# Patient Record
Sex: Female | Born: 1965 | Race: White | Hispanic: No | Smoking: Never smoker
Health system: Southern US, Community
[De-identification: ages and names within clinical notes are randomized; demographics above are authoritative.]

## PROBLEM LIST (undated history)

## (undated) DIAGNOSIS — K219 Gastro-esophageal reflux disease without esophagitis: Secondary | ICD-10-CM

## (undated) DIAGNOSIS — T8859XA Other complications of anesthesia, initial encounter: Secondary | ICD-10-CM

## (undated) DIAGNOSIS — E559 Vitamin D deficiency, unspecified: Principal | ICD-10-CM

## (undated) DIAGNOSIS — M549 Dorsalgia, unspecified: Secondary | ICD-10-CM

## (undated) DIAGNOSIS — G44209 Tension-type headache, unspecified, not intractable: Secondary | ICD-10-CM

## (undated) DIAGNOSIS — E049 Nontoxic goiter, unspecified: Secondary | ICD-10-CM

## (undated) DIAGNOSIS — R112 Nausea with vomiting, unspecified: Secondary | ICD-10-CM

## (undated) DIAGNOSIS — R87629 Unspecified abnormal cytological findings in specimens from vagina: Secondary | ICD-10-CM

## (undated) DIAGNOSIS — I10 Essential (primary) hypertension: Secondary | ICD-10-CM

## (undated) DIAGNOSIS — T4145XA Adverse effect of unspecified anesthetic, initial encounter: Secondary | ICD-10-CM

## (undated) DIAGNOSIS — N189 Chronic kidney disease, unspecified: Secondary | ICD-10-CM

## (undated) DIAGNOSIS — F419 Anxiety disorder, unspecified: Secondary | ICD-10-CM

## (undated) DIAGNOSIS — Z9889 Other specified postprocedural states: Secondary | ICD-10-CM

## (undated) DIAGNOSIS — G8929 Other chronic pain: Secondary | ICD-10-CM

## (undated) HISTORY — DX: Chronic kidney disease, unspecified: N18.9

## (undated) HISTORY — DX: Dorsalgia, unspecified: M54.9

## (undated) HISTORY — DX: Tension-type headache, unspecified, not intractable: G44.209

## (undated) HISTORY — DX: Unspecified abnormal cytological findings in specimens from vagina: R87.629

## (undated) HISTORY — DX: Essential (primary) hypertension: I10

## (undated) HISTORY — DX: Vitamin D deficiency, unspecified: E55.9

## (undated) HISTORY — DX: Nontoxic goiter, unspecified: E04.9

---

## 1993-10-29 HISTORY — PX: TUBAL LIGATION: SHX77

## 2001-08-15 ENCOUNTER — Other Ambulatory Visit: Admission: RE | Admit: 2001-08-15 | Discharge: 2001-08-15 | Payer: Self-pay | Admitting: Obstetrics and Gynecology

## 2002-03-13 ENCOUNTER — Encounter: Payer: Self-pay | Admitting: Obstetrics & Gynecology

## 2002-03-13 ENCOUNTER — Ambulatory Visit (HOSPITAL_COMMUNITY): Admission: RE | Admit: 2002-03-13 | Discharge: 2002-03-13 | Payer: Self-pay | Admitting: Family Medicine

## 2002-06-10 ENCOUNTER — Ambulatory Visit (HOSPITAL_COMMUNITY): Admission: RE | Admit: 2002-06-10 | Discharge: 2002-06-10 | Payer: Self-pay | Admitting: Obstetrics & Gynecology

## 2002-06-10 ENCOUNTER — Encounter: Payer: Self-pay | Admitting: Obstetrics & Gynecology

## 2005-04-10 ENCOUNTER — Encounter: Admission: RE | Admit: 2005-04-10 | Discharge: 2005-04-10 | Payer: Self-pay | Admitting: Obstetrics and Gynecology

## 2007-06-08 ENCOUNTER — Emergency Department (HOSPITAL_COMMUNITY): Admission: EM | Admit: 2007-06-08 | Discharge: 2007-06-08 | Payer: Self-pay | Admitting: Emergency Medicine

## 2007-10-30 HISTORY — PX: CHOLECYSTECTOMY: SHX55

## 2008-01-01 ENCOUNTER — Emergency Department (HOSPITAL_COMMUNITY): Admission: EM | Admit: 2008-01-01 | Discharge: 2008-01-01 | Payer: Self-pay | Admitting: Emergency Medicine

## 2008-02-12 ENCOUNTER — Emergency Department (HOSPITAL_COMMUNITY): Admission: EM | Admit: 2008-02-12 | Discharge: 2008-02-12 | Payer: Self-pay | Admitting: Emergency Medicine

## 2008-07-21 ENCOUNTER — Ambulatory Visit (HOSPITAL_COMMUNITY): Admission: RE | Admit: 2008-07-21 | Discharge: 2008-07-21 | Payer: Self-pay | Admitting: Family Medicine

## 2008-07-26 ENCOUNTER — Ambulatory Visit (HOSPITAL_COMMUNITY): Admission: RE | Admit: 2008-07-26 | Discharge: 2008-07-26 | Payer: Self-pay | Admitting: General Surgery

## 2008-07-26 ENCOUNTER — Encounter (INDEPENDENT_AMBULATORY_CARE_PROVIDER_SITE_OTHER): Payer: Self-pay | Admitting: General Surgery

## 2008-10-29 HISTORY — PX: ENDOMETRIAL BIOPSY: SHX622

## 2009-05-13 ENCOUNTER — Ambulatory Visit (HOSPITAL_COMMUNITY): Admission: RE | Admit: 2009-05-13 | Discharge: 2009-05-13 | Payer: Self-pay | Admitting: Family Medicine

## 2009-09-15 ENCOUNTER — Other Ambulatory Visit: Admission: RE | Admit: 2009-09-15 | Discharge: 2009-09-15 | Payer: Self-pay | Admitting: Obstetrics and Gynecology

## 2009-09-20 ENCOUNTER — Ambulatory Visit (HOSPITAL_COMMUNITY): Admission: RE | Admit: 2009-09-20 | Discharge: 2009-09-20 | Payer: Self-pay | Admitting: Obstetrics & Gynecology

## 2009-10-29 HISTORY — PX: ENDOMETRIAL ABLATION: SHX621

## 2009-12-19 ENCOUNTER — Encounter: Admission: RE | Admit: 2009-12-19 | Discharge: 2009-12-19 | Payer: Self-pay | Admitting: Obstetrics and Gynecology

## 2009-12-26 ENCOUNTER — Encounter: Admission: RE | Admit: 2009-12-26 | Discharge: 2009-12-26 | Payer: Self-pay | Admitting: Obstetrics and Gynecology

## 2010-05-01 ENCOUNTER — Emergency Department (HOSPITAL_COMMUNITY): Admission: EM | Admit: 2010-05-01 | Discharge: 2010-05-01 | Payer: Self-pay | Admitting: Emergency Medicine

## 2010-06-30 ENCOUNTER — Encounter: Admission: RE | Admit: 2010-06-30 | Discharge: 2010-06-30 | Payer: Self-pay | Admitting: Obstetrics and Gynecology

## 2010-09-19 ENCOUNTER — Ambulatory Visit (HOSPITAL_COMMUNITY)
Admission: RE | Admit: 2010-09-19 | Discharge: 2010-09-19 | Payer: Self-pay | Source: Home / Self Care | Admitting: Obstetrics & Gynecology

## 2010-10-20 ENCOUNTER — Ambulatory Visit (HOSPITAL_COMMUNITY)
Admission: RE | Admit: 2010-10-20 | Discharge: 2010-10-20 | Payer: Self-pay | Source: Home / Self Care | Attending: Obstetrics and Gynecology | Admitting: Obstetrics and Gynecology

## 2010-11-19 ENCOUNTER — Encounter: Payer: Self-pay | Admitting: Obstetrics and Gynecology

## 2010-11-20 ENCOUNTER — Encounter: Payer: Self-pay | Admitting: Family Medicine

## 2011-01-08 LAB — COMPREHENSIVE METABOLIC PANEL
ALT: 13 U/L (ref 0–35)
AST: 15 U/L (ref 0–37)
Albumin: 3.2 g/dL — ABNORMAL LOW (ref 3.5–5.2)
CO2: 26 mEq/L (ref 19–32)
Calcium: 8.7 mg/dL (ref 8.4–10.5)
Chloride: 107 mEq/L (ref 96–112)
Glucose, Bld: 92 mg/dL (ref 70–99)
Sodium: 139 mEq/L (ref 135–145)

## 2011-01-08 LAB — URINALYSIS, ROUTINE W REFLEX MICROSCOPIC
Bilirubin Urine: NEGATIVE
Glucose, UA: NEGATIVE mg/dL
Ketones, ur: NEGATIVE mg/dL
Urobilinogen, UA: 0.2 mg/dL (ref 0.0–1.0)

## 2011-01-08 LAB — SURGICAL PCR SCREEN: Staphylococcus aureus: NEGATIVE

## 2011-01-08 LAB — URINE MICROSCOPIC-ADD ON

## 2011-01-08 LAB — CBC
Platelets: 330 10*3/uL (ref 150–400)
RBC: 3.82 MIL/uL — ABNORMAL LOW (ref 3.87–5.11)
RDW: 13.2 % (ref 11.5–15.5)
WBC: 5.1 10*3/uL (ref 4.0–10.5)

## 2011-01-14 LAB — BASIC METABOLIC PANEL
BUN: 9 mg/dL (ref 6–23)
Calcium: 8.4 mg/dL (ref 8.4–10.5)
Chloride: 106 mEq/L (ref 96–112)
Creatinine, Ser: 0.68 mg/dL (ref 0.4–1.2)
GFR calc Af Amer: >60 mL/min (ref >60)
Potassium: 3.6 mEq/L (ref 3.5–5.1)

## 2011-01-14 LAB — URINE CULTURE: Colony Count: 35000

## 2011-01-14 LAB — DIFFERENTIAL
Basophils Absolute: 0 10*3/uL (ref 0.0–0.1)
Eosinophils Absolute: 0.1 10*3/uL (ref 0.0–0.7)
Eosinophils Relative: 1 % (ref 0–5)
Lymphocytes Relative: 21 % (ref 12–46)
Lymphs Abs: 1.9 10*3/uL (ref 0.7–4.0)
Monocytes Absolute: 0.4 10*3/uL (ref 0.1–1.0)

## 2011-01-14 LAB — PREGNANCY, URINE: Preg Test, Ur: NEGATIVE

## 2011-01-14 LAB — URINALYSIS, ROUTINE W REFLEX MICROSCOPIC
Glucose, UA: NEGATIVE mg/dL
Ketones, ur: NEGATIVE mg/dL
Nitrite: NEGATIVE
Protein, ur: NEGATIVE mg/dL
pH: 7 (ref 5.0–8.0)

## 2011-01-14 LAB — URINE MICROSCOPIC-ADD ON

## 2011-01-14 LAB — CBC
Hemoglobin: 11.5 g/dL — ABNORMAL LOW (ref 12.0–15.0)
MCH: 31.6 pg (ref 26.0–34.0)
MCV: 94.3 fL (ref 78.0–100.0)
RBC: 3.63 MIL/uL — ABNORMAL LOW (ref 3.87–5.11)
WBC: 8.9 10*3/uL (ref 4.0–10.5)

## 2011-03-13 NOTE — Op Note (Signed)
NAMEJESSECA, MARSCH               ACCOUNT NO.:  [id number]   MEDICAL RECORD NO.:  [id number]          PATIENT TYPE:  AMB   LOCATION:  DAY                           FACILITY:  [facility]   PHYSICIAN:  Tilford Pillar, MD      DATE OF BIRTH:  03/17/1966   DATE OF PROCEDURE:  07/26/2008  DATE OF DISCHARGE:  07/26/2008                               OPERATIVE REPORT   PREOPERATIVE DIAGNOSES:  1. Cholelithiasis.  2. Acute cholecystitis.   POSTOPERATIVE DIAGNOSES:  1. Cholelithiasis.  2. Acute cholecystitis.   PROCEDURE:  Laparoscopic cholecystectomy.   SURGEON:  Tilford Pillar, MD   ANESTHESIA:  General endotracheal, local anesthetic, 1% Sensorcaine  plain.   SPECIMEN:  Gallbladder.   ESTIMATED BLOOD LOSS:  Minimal.   INDICATIONS:  The patient had presented with history of severe right  upper quadrant abdominal pain.  She was evaluated with right upper  quadrant ultrasound demonstrating evidence of cholelithiasis and changes  consistent with acute cholecystitis.  Her symptomatology was relatively  persistent.  She had had similar symptoms in the past and never severe.  The risks, benefits, and alternatives of a laparoscopic possible open  cholecystectomy were discussed at length with the patient including but  not limited to risk of bleeding, infection, bile leak, small bowel  injury, common bile duct injury, as well as the possibility of  intraoperative cardiac and pulmonary events.  The patient's questions  and concerns were addressed.  The patient was consented for the planned  procedure.   OPERATION:  The patient was taken to the operating room, was placed in a  supine position on the operating room table at which time the general  anesthetic was administered.  Once the patient was asleep, she was  endotracheally intubated by anesthesia.  At this point, her abdomen was  prepped with DuraPrep solution and draped in a standard fashion.  A stab  incision was created supraumbilically  with an 11-blade scalpel.  Additional dissection down through the subcuticular tissue was carried  out using a Kocher clamp which was utilized to grasp the abdominal wall  fascia and move this anterior.  A Veress needle was then inserted.  Drop  test was utilized to confirm intraperitoneal placement and then  pneumoperitoneum was initiated.  Once sufficient pneumoperitoneum was  obtained, an 11-mm trocar was inserted over laparoscope allowing  visualization of the trocar entering into the peritoneal cavity.  At  this point, the inner cannula was removed.  The laparoscope was  reinserted.  There was no evidence of any trocar or Veress needle  placement injury.  The patient was placed in a reverse Trendelenburg  left lateral decubitus position, and the remaining trocars were placed.  An 11-mm trocar in the epigastrium, a 5-mm trocar in the midline between  two 11-mm trocars and a 5-mm trocar in the right lateral abdominal wall.  At this point, the fundus of the gallbladder was grasped and lifted up  and over the right lobe of the liver.  Blunt dissection was utilized to  strip the peritoneal reflection off the infundibulum of the gallbladder.  The cystic duct was identified.  A window was created behind the cystic  duct.  Three EndoClips were placed proximally, one distally, and the  cystic duct was divided between two more cystic clips.  Similarly, the  cystic artery was identified.  Two EndoClips were placed proximally, one  distally, and cystic artery was divided between two more cystic clips.  At this point, electrocautery was utilized to dissect the gallbladder  free from the gallbladder fossa.  Hemostasis was obtained during this  time using the electrocautery.  At this time, gallbladder was removed  and was placed into an EndoCatch bag which was placed up and over the  right lobe of the liver.  Reinspecting the gallbladder fossa, there was  no evidence of any significant bleeding.   Hemostasis was excellent and a  piece of Surgicel was placed into the gallbladder fossa.  At this point,  the attention was turned to closure.   Using an Endoclose suture passing device, a 2-0 Vicryl was passed  through both the 11-mm trocar sites with these sutures in place.  The  gallbladder was removed through the epigastric trocar site in an intact  EndoCatch bag.  Some blunt and sharp dissection was required to enlarge  the trocar site adequately enough to remove the gallbladder.  Once it  was free, it was placed in the back table and was sent as a permanent  specimen to pathology.  At this time the pneumoperitoneum was evacuated,  the Vicryl sutures were secured, local anesthetic was instilled.  A 4-0  Monocryl was utilized to reapproximate the skin edges at all 4 trocar  sites and skin was washed and dried with moist and dry towel.  Benzoin  was applied around incision, 0.5-inch Steri-Strips were placed, the  drapes removed.  The patient was allowed to come out of general  anesthetic and was transferred back to regular hospital bed.  She was  transferred to postanesthetic care unit in a stable condition.  At the  conclusion of procedure, all instrument, sponge, and needle counts were  correct.  The patient tolerated the procedure well.      Tilford Pillar, MD  Electronically Signed     BZ/MEDQ  D:  08/09/2008  T:  08/10/2008  Job:  [id number]   cc:   Kirk Ruths, M.D.  Fax: [phone]

## 2011-03-13 NOTE — H&P (Signed)
Kristie Graham, Kristie Graham            ACCOUNT NO.:  [id number]   MEDICAL RECORD NO.:  [id number]         PATIENT TYPE:  PAMB   LOCATION:  DAY                           FACILITY:  [facility]   PHYSICIAN:  Tilford Pillar, MD      DATE OF BIRTH:  18-Jun-1966   DATE OF ADMISSION:  DATE OF DISCHARGE:  LH                              HISTORY & PHYSICAL   CHIEF COMPLAINT:  Pain and nausea.   HISTORY OF PRESENT ILLNESS:  The patient is a 45 year old female who  presented to my office after referral from her primary care doctor, Dr.  Regino Schultze with a history of epigastric abdominal pain.  She has had this  pain for several weeks, which approximately started 3 weeks ago with  associated nausea.  She has not had any episodes of emesis.  The pain is  in epigastrium with some radiation down to the suprapubic region as well  as some radiation to bilateral back.  She describes this also being  somewhat localized to the right upper quadrant.  While she has felt  nauseated, she has had no episodes of emesis.  She has had some  intermittent diarrhea.  She describes some of the stools as dark in  color.  She has denied any hematochezia.  She has not had any similar  symptoms in the past.  She denies any fever or chills.  She does state  she has bloating, occasional belching, and flatus.  She describes the  pain as a sharp intermittent pain, which has lasted over the last week  with more pain episodes of painful periods.  She has noted that bending  does seem to cause increased discomfort in the area and has intermittent  increase with fried or fatty greasy foods, somewhat relieved with  Sprite, otherwise nothing seems to relieve her symptomatology.  She has  had no prior EGD performed.  She has had no history of previous peptic  ulcer disease.  She has had no history of jaundice.  There is a family  history of biliary disease in both her mother and an aunt having history  of gallbladder disease.  No apparent  history of [facility].   PAST MEDICAL HISTORY:  History of urinary tract infection, she is  currently being treated for.   PAST SURGICAL HISTORY:  None.   MEDICATIONS:  She is currently on 2 antibiotics which she does not  remember the names of as well as on Prilosec.   ALLERGIES:  No known drug allergies.   SOCIAL HISTORY:  No tobacco, no alcohol, no recreational drug use.  Her  occupation is weaver.   REVIEW OF SYSTEMS:  CONSTITUTIONAL:  Unremarkable.  EYES:  Unremarkable.  EARS, NOSE, and THROAT:  Unremarkable.  RESPIRATORY:  Unremarkable.  CARDIOVASCULAR:  Unremarkable.  GASTROINTESTINAL:  Stomach pain, nausea,  vomiting as per HPI.  GENITOURINARY:  UTI which is currently being  treated.  MUSCULOSKELETAL:  Arthralgias of the back.  SKIN:  Unremarkable.  ENDOCRINE:  She complains of decreased energy.  NEURO:  Unremarkable.   PHYSICAL EXAMINATION:  GENERAL:  The patient is  a mildly obese female.  She is in no acute distress.  She is alert and oriented x3.  She is here  today with her fiance.  HEENT:  Scalp, no deformities, no masses.  Eyes, pupils equal, round,  and reactive.  Extraocular movements are intact.  No conjunctival pallor  or scleral icterus are noted.  Oral mucosa is pink.  Normal occlusion.  NECK:  Trachea is midline.  No cervical lymphadenopathy is appreciated.  PULMONARY:  Unlabored respirations.  She is clear to auscultation.  No  wheezes.  No crackles.  CARDIOVASCULAR:  Regular rate and rhythm.  I did not appreciate any  murmurs or gallops today in the office.  She has 2+ radial and dorsalis  pedis pulses bilaterally.  ABDOMEN:  Positive bowel sounds.  Abdomen is soft.  She does have right  upper quadrant abdominal pain, and she does elicit a Murphy sign with  deep inspiration.  No hernias.  No masses were apparent.  SKIN:  Warm and dry.   PERTINENT LABORATORY AND RADIOGRAPHIC STUDIES:  The patient has had a  previous right upper quadrant  ultrasound, which does demonstrate  cholelithiasis.  There is no evidence of any biliary tree dilatation.  No gallbladder wall thickening.  No pericholecystic fluid.   ASSESSMENT/PLAN:  Cholelithiasis.  At this point, I had a discussion  with the patient in regards to her symptomatology.  I do feel that the  majority of her symptoms are related to biliary etiology and I did  discuss the risks, benefits, and alternatives of laparoscopic possible  open cholecystectomy including, but not limited to risk of bleeding,  infection, small-bowel injury, common bile duct injury, as well as the  possibility of intraoperative cardiac and pulmonary events.  Initially,  I did discuss with the patient the possibility of concomitant processes  and although my suspicion for esophagitis or peptic ulcer disease  gastric etiology are very low, I did discuss with the patient these are  all possibilities.  She has had some resolution of her oral sour taste  with Prilosec, and I do think that she probably have some component of  gastroesophageal reflux disease and did discuss with the patient that  these symptoms are unlikely to get better with cholecystectomy and that  if any remaining symptomatology following her cholecystectomy, may  require an upper endoscopy for further evaluation.  She understands this  and does wish to proceed at the earliest convenience for a laparoscopic  possible open cholecystectomy.      Tilford Pillar, MD  Electronically Signed     BZ/MEDQ  D:  07/22/2008  T:  07/23/2008  Job:  [id number]   cc:   Kirk Ruths, M.D.  Fax: [phone]   Melony Overly, PA   Short Stay Surgery

## 2011-07-23 LAB — DIFFERENTIAL
Eosinophils Absolute: 0
Eosinophils Relative: 0
Lymphocytes Relative: 29
Lymphs Abs: 1.9
Monocytes Absolute: 0.4
Monocytes Relative: 7

## 2011-07-23 LAB — CBC
HCT: 32.8 — ABNORMAL LOW
Hemoglobin: 11.2 — ABNORMAL LOW
MCV: 87.6
Platelets: 227
RBC: 3.74 — ABNORMAL LOW
WBC: 6.5

## 2011-07-23 LAB — POCT CARDIAC MARKERS
Operator id: 230251
Troponin i, poc: <0.05
Troponin i, poc: <0.05

## 2011-07-23 LAB — BASIC METABOLIC PANEL
BUN: 13
Chloride: 103
Potassium: 4.1
Sodium: 135

## 2011-07-30 LAB — BASIC METABOLIC PANEL
BUN: 8
Chloride: 105
GFR calc Af Amer: >60
Potassium: 3.9

## 2011-07-30 LAB — CBC
HCT: 31.9 — ABNORMAL LOW
MCV: 89.1
RBC: 3.59 — ABNORMAL LOW
WBC: 5.1

## 2011-08-13 LAB — BASIC METABOLIC PANEL
BUN: 8
CO2: 25
Chloride: 106
Glucose, Bld: 121 — ABNORMAL HIGH
Potassium: 3.3 — ABNORMAL LOW

## 2011-08-13 LAB — CBC
HCT: 34.2 — ABNORMAL LOW
MCV: 91.6
Platelets: 243
RDW: 13.3

## 2011-08-13 LAB — DIFFERENTIAL
Basophils Absolute: 0
Eosinophils Absolute: 0
Eosinophils Relative: 0
Lymphs Abs: 1.7

## 2011-10-05 ENCOUNTER — Other Ambulatory Visit: Payer: Self-pay | Admitting: Adult Health

## 2011-10-05 ENCOUNTER — Other Ambulatory Visit (HOSPITAL_COMMUNITY)
Admission: RE | Admit: 2011-10-05 | Discharge: 2011-10-05 | Disposition: A | Discharge status: Home / Self Care | Payer: 59 | Source: Ambulatory Visit | Attending: Obstetrics and Gynecology | Admitting: Obstetrics and Gynecology

## 2011-10-05 DIAGNOSIS — Z01419 Encounter for gynecological examination (general) (routine) without abnormal findings: Secondary | ICD-10-CM | POA: Insufficient documentation

## 2011-10-30 HISTORY — PX: COLONOSCOPY: SHX174

## 2011-10-31 ENCOUNTER — Ambulatory Visit: Payer: Self-pay | Admitting: Urgent Care

## 2011-11-05 ENCOUNTER — Ambulatory Visit: Payer: Self-pay | Admitting: Gastroenterology

## 2011-11-07 ENCOUNTER — Telehealth: Payer: Self-pay | Admitting: Gastroenterology

## 2011-11-07 ENCOUNTER — Encounter: Payer: Self-pay | Admitting: Gastroenterology

## 2011-11-07 ENCOUNTER — Ambulatory Visit: Payer: Self-pay | Admitting: Gastroenterology

## 2011-11-07 ENCOUNTER — Ambulatory Visit (INDEPENDENT_AMBULATORY_CARE_PROVIDER_SITE_OTHER): Payer: 59 | Admitting: Gastroenterology

## 2011-11-07 VITALS — BP 131/83 | HR 67 | Temp 98.2°F | Ht 63.0 in | Wt 214.2 lb

## 2011-11-07 DIAGNOSIS — K625 Hemorrhage of anus and rectum: Secondary | ICD-10-CM

## 2011-11-07 DIAGNOSIS — Z800 Family history of malignant neoplasm of digestive organs: Secondary | ICD-10-CM

## 2011-11-07 MED ORDER — PEG-KCL-NACL-NASULF-NA ASC-C 100 G PO SOLR: 1.0000 | Freq: Once | ORAL | Status: DC

## 2011-11-07 NOTE — Patient Instructions (Signed)
We have scheduled you for a colonoscopy with Dr. Fields. Please see separate instructions. 

## 2011-11-07 NOTE — Progress Notes (Signed)
Primary Care Physician:  Cassell Smiles., MD, MD  Primary Gastroenterologist:  Jonette Eva, MD   Chief Complaint  Patient presents with  . Rectal Bleeding    not now    HPI:  Kristie Graham is a 46 y.o. female here for further evaluation of intermittent rectal bleeding at the request of Cyril Mourning ANP/GNP with Dr. Emelda Fear. BRBPR couple of times. Once with loose stool and also with hard stool. Small amount. PP loose stools chronically. No weight loss. Denies heartburn, dysphagia, n/v, weight loss. States her 1/2 sister was diagnosed with colon cancer in her 29s. Father died of metastatic cancer unknown primary at age 48.   Current Outpatient Prescriptions  Medication Sig Dispense Refill  . ALPRAZolam (XANAX) 1 MG tablet Take 1 mg by mouth at bedtime as needed.       Marland Kitchen HYDROcodone-acetaminophen (NORCO) 10-325 MG per tablet Take 1 tablet by mouth every 4 (four) hours as needed.       Marland Kitchen oxyCODONE (ROXICODONE) 15 MG immediate release tablet Take 15 mg by mouth every 4 (four) hours as needed.         Allergies as of 11/07/2011  . (No Known Allergies)    Past Medical History  Diagnosis Date  . Back pain     Past Surgical History  Procedure Date  . Cholecystectomy 2009  . Tubal ligation 1995  . Endometrial biopsy 2010  . Endometrial ablation 2011    Family History  Problem Relation Age of Onset  . Prostate cancer Brother   . Cancer Father     ?colon, deceased age 77  . Colon cancer Sister     1/2 sister, dx in 78s    History   Social History  . Marital Status: Single    Spouse Name: N/A    Number of Children: N/A  . Years of Education: N/A   Occupational History  . Not on file.   Social History Main Topics  . Smoking status: Never Smoker   . Smokeless tobacco: Not on file  . Alcohol Use: Yes     socially  . Drug Use: No  . Sexually Active: Not on file   Other Topics Concern  . Not on file   Social History Narrative  . No narrative on file      ROS:  General: Negative for anorexia, weight loss, fever, chills, fatigue, weakness. Eyes: Negative for vision changes.  ENT: Negative for hoarseness, difficulty swallowing , nasal congestion. CV: Negative for chest pain, angina, palpitations, dyspnea on exertion, peripheral edema.  Respiratory: Negative for dyspnea at rest, dyspnea on exertion, cough, sputum, wheezing.  GI: See history of present illness. GU:  Negative for dysuria, hematuria, urinary incontinence, urinary frequency, nocturnal urination.  MS: Negative for joint pain. Chronic low back pain.  Derm: Negative for rash or itching.  Neuro: Negative for weakness, abnormal sensation, seizure, frequent headaches, memory loss, confusion.  Psych: Negative for anxiety, depression, suicidal ideation, hallucinations.  Endo: Negative for unusual weight change.  Heme: Negative for bruising or bleeding. Allergy: Negative for rash or hives.    Physical Examination:  BP 131/83  Pulse 67  Temp(Src) 98.2 F (36.8 C) (Temporal)  Ht 5\' 3"  (1.6 m)  Wt 214 lb 3.2 oz (97.16 kg)  BMI 37.94 kg/m2   General: Well-nourished, well-developed in no acute distress.  Head: Normocephalic, atraumatic.   Eyes: Conjunctiva pink, no icterus. Mouth: Oropharyngeal mucosa moist and pink , no lesions erythema or exudate. Neck: Supple without  thyromegaly, masses, or lymphadenopathy.  Lungs: Clear to auscultation bilaterally.  Heart: Regular rate and rhythm, no murmurs rubs or gallops.  Abdomen: Bowel sounds are normal, nontender, nondistended, no hepatosplenomegaly or masses, no abdominal bruits or    hernia , no rebound or guarding.   Rectal: Defer to time of colonoscopy. Recently heme negative on DRE at gyn. Extremities: No lower extremity edema. No clubbing or deformities.  Neuro: Alert and oriented x 4 , grossly normal neurologically.  Skin: Warm and dry, no rash or jaundice.   Psych: Alert and cooperative, normal mood and affect.

## 2011-11-07 NOTE — Telephone Encounter (Signed)
Pt aware of pre op on 01/23 @ 10:15

## 2011-11-08 NOTE — H&P (Signed)
Reason for Visit     Rectal Bleeding    not now        Vitals - Last Recorded       BP Pulse Temp(Src) Ht Wt BMI    131/83  67  98.2 F (36.8 C) (Temporal)  5\' 3"  (1.6 m)  214 lb 3.2 oz (97.16 kg)  37.94 kg/m2       Progress Notes     Tana Coast, PA  11/07/2011  2:39 PM  Pended Primary Care Physician:  Cassell Smiles., MD, MD   Primary Gastroenterologist:  Jonette Eva, MD      Chief Complaint   Patient presents with   .  Rectal Bleeding       not now      HPI:  Kristie Graham is a 46 y.o. female here    BRBPR couple of times. Once with loose stool and also with hard stool. Small amount. PP loose stools. No weight loss.     Current Outpatient Prescriptions   Medication  Sig  Dispense  Refill   .  ALPRAZolam (XANAX) 1 MG tablet  Take 1 mg by mouth at bedtime as needed.          Marland Kitchen  HYDROcodone-acetaminophen (NORCO) 10-325 MG per tablet  Take 1 tablet by mouth every 4 (four) hours as needed.          Marland Kitchen  oxyCODONE (ROXICODONE) 15 MG immediate release tablet  Take 15 mg by mouth every 4 (four) hours as needed.              Allergies as of 11/07/2011   .  (No Known Allergies)       Past Medical History   Diagnosis  Date   .  Back pain         Past Surgical History   Procedure  Date   .  Cholecystectomy  2009   .  Tubal ligation  1995   .  Endometrial biopsy  2010   .  Endometrial ablation  2011       Family History   Problem  Relation  Age of Onset   .  Prostate cancer  Brother     .  Cancer  Father         ?colon, deceased age 45   .  Colon cancer  Sister         1/2 sister, dx in 79s       History       Social History   .  Marital Status:  Single       Spouse Name:  N/A       Number of Children:  N/A   .  Years of Education:  N/A       Occupational History   .  Not on file.       Social History Main Topics   .  Smoking status:  Never Smoker    .  Smokeless tobacco:  Not on file   .  Alcohol Use:  Yes         socially   .   Drug Use:  No   .  Sexually Active:  Not on file       Other Topics  Concern   .  Not on file       Social History Narrative   .  No narrative on file        ROS:  General: Negative for anorexia, weight loss, fever, chills, fatigue, weakness. Eyes: Negative for vision changes.   ENT: Negative for hoarseness, difficulty swallowing , nasal congestion. CV: Negative for chest pain, angina, palpitations, dyspnea on exertion, peripheral edema.   Respiratory: Negative for dyspnea at rest, dyspnea on exertion, cough, sputum, wheezing.   GI: See history of present illness. GU:  Negative for dysuria, hematuria, urinary incontinence, urinary frequency, nocturnal urination.   MS: Negative for joint pain, low back pain.   Derm: Negative for rash or itching.   Neuro: Negative for weakness, abnormal sensation, seizure, frequent headaches, memory loss, confusion.   Psych: Negative for anxiety, depression, suicidal ideation, hallucinations.   Endo: Negative for unusual weight change.   Heme: Negative for bruising or bleeding. Allergy: Negative for rash or hives.     Physical Examination:   BP 131/83  Pulse 67  Temp(Src) 98.2 F (36.8 C) (Temporal)  Ht 5\' 3"  (1.6 m)  Wt 214 lb 3.2 oz (97.16 kg)  BMI 37.94 kg/m2    General: Well-nourished, well-developed in no acute distress.   Head: Normocephalic, atraumatic.    Eyes: Conjunctiva pink, no icterus. Mouth: Oropharyngeal mucosa moist and pink , no lesions erythema or exudate. Neck: Supple without thyromegaly, masses, or lymphadenopathy.   Lungs: Clear to auscultation bilaterally.   Heart: Regular rate and rhythm, no murmurs rubs or gallops.   Abdomen: Bowel sounds are normal, nontender, nondistended, no hepatosplenomegaly or masses, no abdominal bruits or    hernia , no rebound or guarding.    Rectal:  Extremities: No lower extremity edema. No clubbing or deformities.   Neuro: Alert and oriented x 4 , grossly normal neurologically.    Skin: Warm and dry, no rash or jaundice.    Psych: Alert and cooperative, normal mood and affect.   Labs:   Imaging Studies: No results found.

## 2011-11-09 NOTE — Assessment & Plan Note (Signed)
Intermittent rectal bleeding in setting of chronic pp loose stool, FH CRC. 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 Needs colonoscopy for further evaluation and high risk CRC screening due to FH. Patient takes Xanax and narcotics chronically. She states she usually requires more sedation. Recommend deep sedation.  I have discussed the risks, alternatives, benefits with regards to but not limited to the risk of reaction to medication, bleeding, infection, perforation and the patient is agreeable to proceed. Written consent to be obtained.

## 2011-11-12 NOTE — Progress Notes (Signed)
Faxed to PCP

## 2011-11-12 NOTE — Progress Notes (Signed)
REVIEWED. AGREE. 

## 2011-11-15 ENCOUNTER — Encounter (HOSPITAL_COMMUNITY): Payer: Self-pay | Admitting: Pharmacy Technician

## 2011-11-16 ENCOUNTER — Ambulatory Visit: Payer: Self-pay | Admitting: Urgent Care

## 2011-11-21 ENCOUNTER — Encounter (HOSPITAL_COMMUNITY): Admission: RE | Admit: 2011-11-21 | Payer: 59 | Source: Ambulatory Visit

## 2011-11-22 ENCOUNTER — Encounter (HOSPITAL_COMMUNITY)
Admission: RE | Admit: 2011-11-22 | Discharge: 2011-11-22 | Disposition: A | Discharge status: Home / Self Care | Payer: 59 | Source: Ambulatory Visit | Attending: Gastroenterology | Admitting: Gastroenterology

## 2011-11-22 ENCOUNTER — Encounter (HOSPITAL_COMMUNITY): Payer: Self-pay

## 2011-11-22 HISTORY — DX: Other complications of anesthesia, initial encounter: T88.59XA

## 2011-11-22 HISTORY — DX: Other specified postprocedural states: R11.2

## 2011-11-22 HISTORY — DX: Other specified postprocedural states: Z98.890

## 2011-11-22 HISTORY — DX: Adverse effect of unspecified anesthetic, initial encounter: T41.45XA

## 2011-11-22 LAB — HEMOGLOBIN AND HEMATOCRIT, BLOOD: Hemoglobin: 12.8 g/dL (ref 12.0–15.0)

## 2011-11-22 NOTE — Patient Instructions (Signed)
[address] Donnelly  11/22/2011   Your procedure is scheduled on:  11/26/11  Report to [facility] at 06:15 AM.  Call this number if you have problems the morning of surgery: [phone]   Remember:   Do not eat food:After Midnight.  May have clear liquids:until Midnight .  Clear liquids include soda, tea, black coffee, apple or grape juice, broth.  Take these medicines the morning of surgery with A SIP OF WATER: Take your Alprazolam and Hydrocodone only if needed.    Do not wear jewelry, make-up or nail polish.  Do not wear lotions, powders, or perfumes. You may wear deodorant.  Do not shave 48 hours prior to surgery.  Do not bring valuables to the hospital.  Contacts, dentures or bridgework may not be worn into surgery.  Leave suitcase in the car. After surgery it may be brought to your room.  For patients admitted to the hospital, checkout time is 11:00 AM the day of discharge.   Patients discharged the day of surgery will not be allowed to drive home.  Name and phone number of your driver:   Special Instructions: N/A   Please read over the following fact sheets that you were given: Anesthesia Post-op Instructions    Colonoscopy A colonoscopy is an exam to evaluate your entire colon. In this exam, your colon is cleansed. A long fiberoptic tube is inserted through your rectum and into your colon. The fiberoptic scope (endoscope) is a long bundle of enclosed and very flexible fibers. These fibers transmit light to the area examined and send images from that area to your caregiver. Discomfort is usually minimal. You may be given a drug to help you sleep (sedative) during or prior to the procedure. This exam helps to detect lumps (tumors), polyps, inflammation, and areas of bleeding. Your caregiver may also take a small piece of tissue (biopsy) that will be examined under a microscope. LET YOUR CAREGIVER KNOW ABOUT:   Allergies to food or medicine.   Medicines taken, including vitamins,  herbs, eyedrops, over-the-counter medicines, and creams.   Use of steroids (by mouth or creams).   Previous problems with anesthetics or numbing medicines.   History of bleeding problems or blood clots.   Previous surgery.   Other health problems, including diabetes and kidney problems.   Possibility of pregnancy, if this applies.  BEFORE THE PROCEDURE   A clear liquid diet may be required for 2 days before the exam.   Ask your caregiver about changing or stopping your regular medications.   Liquid injections (enemas) or laxatives may be required.   A large amount of electrolyte solution may be given to you to drink over a short period of time. This solution is used to clean out your colon.   You should be present 60 minutes prior to your procedure or as directed by your caregiver.  AFTER THE PROCEDURE   If you received a sedative or pain relieving medication, you will need to arrange for someone to drive you home.   Occasionally, there is a little blood passed with the first bowel movement. Do not be concerned.  FINDING OUT THE RESULTS OF YOUR TEST Not all test results are available during your visit. If your test results are not back during the visit, make an appointment with your caregiver to find out the results. Do not assume everything is normal if you have not heard from your caregiver or the medical facility. It is important for you to follow  up on all of your test results. HOME CARE INSTRUCTIONS   It is not unusual to pass moderate amounts of gas and experience mild abdominal cramping following the procedure. This is due to air being used to inflate your colon during the exam. Walking or a warm pack on your belly (abdomen) may help.   You may resume all normal meals and activities after sedatives and medicines have worn off.   Only take over-the-counter or prescription medicines for pain, discomfort, or fever as directed by your caregiver. Do not use aspirin or blood  thinners if a biopsy was taken. Consult your caregiver for medicine usage if biopsies were taken.  SEEK IMMEDIATE MEDICAL CARE IF:   You have a fever.   You pass large blood clots or fill a toilet with blood following the procedure. This may also occur 10 to 14 days following the procedure. This is more likely if a biopsy was taken.   You develop abdominal pain that keeps getting worse and cannot be relieved with medicine.  Document Released: 10/12/2000 Document Revised: 06/27/2011 Document Reviewed: 05/27/2008 [facility] Patient Information 2012 [place], [place].   PATIENT INSTRUCTIONS POST-ANESTHESIA  IMMEDIATELY FOLLOWING SURGERY:  Do not drive or operate machinery for the first twenty four hours after surgery.  Do not make any important decisions for twenty four hours after surgery or while taking narcotic pain medications or sedatives.  If you develop intractable nausea and vomiting or a severe headache please notify your doctor immediately.  FOLLOW-UP:  Please make an appointment with your surgeon as instructed. You do not need to follow up with anesthesia unless specifically instructed to do so.  WOUND CARE INSTRUCTIONS (if applicable):  Keep a dry clean dressing on the anesthesia/puncture wound site if there is drainage.  Once the wound has quit draining you may leave it open to air.  Generally you should leave the bandage intact for twenty four hours unless there is drainage.  If the epidural site drains for more than 36-48 hours please call the anesthesia department.  QUESTIONS?:  Please feel free to call your physician or the hospital operator if you have any questions, and they will be happy to assist you.     [facility] Anesthesia Department [address] [place] [phone]

## 2011-11-26 ENCOUNTER — Encounter (HOSPITAL_COMMUNITY)
Admission: RE | Disposition: A | Discharge status: Home / Self Care | Payer: Self-pay | Source: Ambulatory Visit | Attending: Gastroenterology

## 2011-11-26 ENCOUNTER — Ambulatory Visit (HOSPITAL_COMMUNITY)
Admission: RE | Admit: 2011-11-26 | Discharge: 2011-11-26 | Disposition: A | Discharge status: Home / Self Care | Payer: 59 | Source: Ambulatory Visit | Attending: Gastroenterology | Admitting: Gastroenterology

## 2011-11-26 ENCOUNTER — Encounter (HOSPITAL_COMMUNITY): Payer: Self-pay

## 2011-11-26 ENCOUNTER — Ambulatory Visit (HOSPITAL_COMMUNITY): Payer: 59 | Admitting: Anesthesiology

## 2011-11-26 ENCOUNTER — Encounter (HOSPITAL_COMMUNITY): Payer: Self-pay | Admitting: Anesthesiology

## 2011-11-26 ENCOUNTER — Other Ambulatory Visit: Payer: Self-pay | Admitting: Gastroenterology

## 2011-11-26 DIAGNOSIS — K5730 Diverticulosis of large intestine without perforation or abscess without bleeding: Secondary | ICD-10-CM | POA: Insufficient documentation

## 2011-11-26 DIAGNOSIS — K625 Hemorrhage of anus and rectum: Secondary | ICD-10-CM | POA: Insufficient documentation

## 2011-11-26 DIAGNOSIS — K621 Rectal polyp: Secondary | ICD-10-CM

## 2011-11-26 DIAGNOSIS — D128 Benign neoplasm of rectum: Secondary | ICD-10-CM | POA: Insufficient documentation

## 2011-11-26 DIAGNOSIS — K648 Other hemorrhoids: Secondary | ICD-10-CM | POA: Insufficient documentation

## 2011-11-26 DIAGNOSIS — Z01812 Encounter for preprocedural laboratory examination: Secondary | ICD-10-CM | POA: Insufficient documentation

## 2011-11-26 DIAGNOSIS — K620 Anal polyp: Secondary | ICD-10-CM

## 2011-11-26 HISTORY — PX: BIOPSY: SHX5522

## 2011-11-26 HISTORY — DX: Dorsalgia, unspecified: M54.9

## 2011-11-26 HISTORY — DX: Other chronic pain: G89.29

## 2011-11-26 HISTORY — DX: Anxiety disorder, unspecified: F41.9

## 2011-11-26 SURGERY — COLONOSCOPY WITH PROPOFOL
Anesthesia: Monitor Anesthesia Care

## 2011-11-26 MED ORDER — LIDOCAINE HCL 1 % IJ SOLN
INTRAMUSCULAR | Status: DC | PRN
  Administered 2011-11-26: 25 mg via INTRADERMAL

## 2011-11-26 MED ORDER — MIDAZOLAM HCL 2 MG/2ML IJ SOLN
INTRAMUSCULAR | Status: AC
  Administered 2011-11-26: 2 mg via INTRAVENOUS
  Filled 2011-11-26: qty 2

## 2011-11-26 MED ORDER — STERILE WATER FOR IRRIGATION IR SOLN
Status: DC | PRN
  Administered 2011-11-26: 08:00:00

## 2011-11-26 MED ORDER — DEXAMETHASONE SODIUM PHOSPHATE 4 MG/ML IJ SOLN
INTRAMUSCULAR | Status: AC
  Filled 2011-11-26: qty 1

## 2011-11-26 MED ORDER — GLYCOPYRROLATE 0.2 MG/ML IJ SOLN
INTRAMUSCULAR | Status: AC
  Filled 2011-11-26: qty 1

## 2011-11-26 MED ORDER — PROPOFOL 10 MG/ML IV EMUL
INTRAVENOUS | Status: DC | PRN
  Administered 2011-11-26: 50 ug/kg/min via INTRAVENOUS

## 2011-11-26 MED ORDER — PROPOFOL 10 MG/ML IV EMUL
INTRAVENOUS | Status: AC
  Filled 2011-11-26: qty 20

## 2011-11-26 MED ORDER — ONDANSETRON HCL 4 MG/2ML IJ SOLN
4.0000 mg | Freq: Once | INTRAMUSCULAR | Status: AC
  Administered 2011-11-26: 4 mg via INTRAVENOUS

## 2011-11-26 MED ORDER — LIDOCAINE HCL (PF) 1 % IJ SOLN
INTRAMUSCULAR | Status: AC
  Filled 2011-11-26: qty 5

## 2011-11-26 MED ORDER — GLYCOPYRROLATE 0.2 MG/ML IJ SOLN
0.2000 mg | Freq: Once | INTRAMUSCULAR | Status: AC
  Administered 2011-11-26: 07:00:00 via INTRAVENOUS

## 2011-11-26 MED ORDER — DEXAMETHASONE SODIUM PHOSPHATE 4 MG/ML IJ SOLN
4.0000 mg | Freq: Once | INTRAMUSCULAR | Status: AC
  Administered 2011-11-26: 4 mg via INTRAVENOUS

## 2011-11-26 MED ORDER — LACTATED RINGERS IV SOLN
INTRAVENOUS | Status: DC
  Administered 2011-11-26: 07:00:00 via INTRAVENOUS

## 2011-11-26 MED ORDER — MIDAZOLAM HCL 2 MG/2ML IJ SOLN
1.0000 mg | INTRAMUSCULAR | Status: DC | PRN
  Administered 2011-11-26: 2 mg via INTRAVENOUS

## 2011-11-26 MED ORDER — MIDAZOLAM HCL 5 MG/5ML IJ SOLN
INTRAMUSCULAR | Status: DC | PRN
  Administered 2011-11-26: 2 mg via INTRAVENOUS

## 2011-11-26 MED ORDER — WATER FOR IRRIGATION, STERILE IR SOLN
Status: DC | PRN
  Administered 2011-11-26: 1000 mL

## 2011-11-26 MED ORDER — MIDAZOLAM HCL 2 MG/2ML IJ SOLN
INTRAMUSCULAR | Status: AC
  Filled 2011-11-26: qty 2

## 2011-11-26 MED ORDER — ONDANSETRON HCL 4 MG/2ML IJ SOLN: 4.0000 mg | Freq: Once | INTRAMUSCULAR | Status: DC | PRN

## 2011-11-26 MED ORDER — FENTANYL CITRATE 0.05 MG/ML IJ SOLN: 25.0000 ug | INTRAMUSCULAR | Status: DC | PRN

## 2011-11-26 MED ORDER — ONDANSETRON HCL 4 MG/2ML IJ SOLN
INTRAMUSCULAR | Status: AC
  Administered 2011-11-26: 4 mg via INTRAVENOUS
  Filled 2011-11-26: qty 2

## 2011-11-26 SURGICAL SUPPLY — 22 items
ELECT REM PT RETURN 9FT ADLT (ELECTROSURGICAL)
ELECTRODE REM PT RTRN 9FT ADLT (ELECTROSURGICAL) IMPLANT
FCP BXJMBJMB 240X2.8X (CUTTING FORCEPS)
FLOOR PAD 36X40 (MISCELLANEOUS) ×3
FORCEPS BIOP RAD 4 LRG CAP 4 (CUTTING FORCEPS) ×3 IMPLANT
FORCEPS BIOP RJ4 240 W/NDL (CUTTING FORCEPS)
FORCEPS BXJMBJMB 240X2.8X (CUTTING FORCEPS) IMPLANT
INJECTOR/SNARE I SNARE (MISCELLANEOUS) IMPLANT
LUBRICANT JELLY 4.5OZ STERILE (MISCELLANEOUS) ×3 IMPLANT
MANIFOLD NEPTUNE II (INSTRUMENTS) ×3 IMPLANT
MANIFOLD NEPTUNE WASTE (CANNULA) ×3 IMPLANT
NEEDLE SCLEROTHERAPY 25GX240 (NEEDLE) IMPLANT
PAD FLOOR 36X40 (MISCELLANEOUS) ×2 IMPLANT
PROBE APC STR FIRE (PROBE) IMPLANT
PROBE INJECTION GOLD (MISCELLANEOUS)
PROBE INJECTION GOLD 7FR (MISCELLANEOUS) IMPLANT
SNARE SHORT THROW 13M SML OVAL (MISCELLANEOUS) IMPLANT
SYR 50ML LL SCALE MARK (SYRINGE) ×3 IMPLANT
TRAP SPECIMEN MUCOUS 40CC (MISCELLANEOUS) IMPLANT
TUBING ENDO SMARTCAP PENTAX (MISCELLANEOUS) ×3 IMPLANT
TUBING IRRIGATION ENDOGATOR (MISCELLANEOUS) ×3 IMPLANT
WATER STERILE IRR 1000ML POUR (IV SOLUTION) ×6 IMPLANT

## 2011-11-26 NOTE — Anesthesia Preprocedure Evaluation (Signed)
Anesthesia Evaluation  Patient identified by MRN, date of birth, ID band Patient awake    Reviewed: Allergy & Precautions, H&P , NPO status , Patient's Chart, lab work & pertinent test results  History of Anesthesia Complications (+) PONV  Airway Mallampati: II      Dental  (+) Teeth Intact   Pulmonary neg pulmonary ROS,  clear to auscultation        Cardiovascular neg cardio ROS Regular Normal    Neuro/Psych Anxiety    GI/Hepatic   Endo/Other    Renal/GU      Musculoskeletal  (+) Arthritis - (chronic LBP, narcotic Rx),   Abdominal   Peds  Hematology   Anesthesia Other Findings   Reproductive/Obstetrics                           Anesthesia Physical Anesthesia Plan  ASA: II  Anesthesia Plan: MAC   Post-op Pain Management:    Induction: Intravenous  Airway Management Planned: Nasal Cannula  Additional Equipment:   Intra-op Plan:   Post-operative Plan:   Informed Consent: I have reviewed the patients History and Physical, chart, labs and discussed the procedure including the risks, benefits and alternatives for the proposed anesthesia with the patient or authorized representative who has indicated his/her understanding and acceptance.     Plan Discussed with:   Anesthesia Plan Comments:         Anesthesia Quick Evaluation

## 2011-11-26 NOTE — Transfer of Care (Signed)
Immediate Anesthesia Transfer of Care Note  Patient: Kristie Graham  Procedure(s) Performed:  COLONOSCOPY WITH PROPOFOL - at cecum 0751; total withdrawal time 11 min; BIOPSY - Rectal polyp biopsy  Patient Location: PACU  Anesthesia Type: MAC  Level of Consciousness: awake, alert , oriented and patient cooperative  Airway & Oxygen Therapy: Patient Spontanous Breathing  Post-op Assessment: Report given to PACU RN, Post -op Vital signs reviewed and stable and Patient moving all extremities  Post vital signs: Reviewed and stable  Complications: No apparent anesthesia complications

## 2011-11-26 NOTE — Op Note (Signed)
[facility] [address] [place], [place]  [address]  COLONOSCOPY PROCEDURE REPORT  PATIENT:  Kristie Graham, Kristie Graham  MR#:  [id number] BIRTHDATE:  November 27, 1965, 45 yrs. old  GENDER:  female  ENDOSCOPIST:  Jonette Eva, MD REF. BY:  Artis Delay, M.D. ASSISTANT:  PROCEDURE DATE:  11/26/2011 PROCEDURE:  Colonoscopy with biopsy  INDICATIONS:  RECTAL BLEEDING  MEDICATIONS:   MAC sedation, administered by CRNA  DESCRIPTION OF PROCEDURE:    Physical exam was performed. Informed consent was obtained from the patient after explaining the benefits, risks, and alternatives to procedure.  The patient was connected to monitor and placed in left lateral position. Continuous oxygen was provided by nasal cannula and IV medicine administered through an indwelling cannula.  After administration of sedation and rectal exam, the patient's rectum was intubated and the  colonoscope was advanced under direct visualization to the cecum.  The scope was removed slowly by carefully examining the color, texture, anatomy, and integrity mucosa on the way out. The patient was recovered in endoscopy and discharged home in satisfactory condition. <<PROCEDUREIMAGES>>  FINDINGS:  There were multiple polyps identified and removed. in the rectum VIA COLD FORCEPS.  RARE Diverticula were found in the sigmoid colon.  SMALL Internal Hemorrhoids were found.  PREP QUALITY:  EXCELENT CECAL W/D TIME:     11 minutes  COMPLICATIONS:    None  ENDOSCOPIC IMPRESSION: 1) Polyps, multiple in the rectum 2) Diverticula in the sigmoid colon 3) Internal hemorrhoids  RECOMMENDATIONS: TCS IN 10 YEARS HIGH FIBER DIET AWAIT BIOPSIES  REPEAT EXAM:  No  ______________________________ Jonette Eva, MD  CC:  Artis Delay, M.D.  n. eSIGNED:   Gaetana Kawahara at 11/26/2011 08:30 AM  Kristeen Mans, [id number]

## 2011-11-26 NOTE — Interval H&P Note (Signed)
History and Physical Interval Note:  11/26/2011 7:35 AM  Kristie Graham  has presented today for surgery, with the diagnosis of rectal bleeding and family hx of CRC  The various methods of treatment have been discussed with the patient and family. After consideration of risks, benefits and other options for treatment, the patient has consented to  Procedure(s): COLONOSCOPY WITH PROPOFOL as a surgical intervention .  The patients' history has been reviewed, patient examined, no change in status, stable for surgery.  I have reviewed the patients' chart and labs.  Questions were answered to the patient's satisfaction.     [employer]

## 2011-11-26 NOTE — Anesthesia Postprocedure Evaluation (Signed)
  Anesthesia Post-op Note  Patient: Kristie Graham  Procedure(s) Performed:  COLONOSCOPY WITH PROPOFOL - at cecum 0751; total withdrawal time 11 min; BIOPSY - Rectal polyp biopsy  Patient Location: PACU  Anesthesia Type: MAC  Level of Consciousness: awake, alert , oriented and patient cooperative  Airway and Oxygen Therapy: Patient Spontanous Breathing  Post-op Pain: none  Post-op Assessment: Post-op Vital signs reviewed, Patient's Cardiovascular Status Stable, Respiratory Function Stable, Patent Airway and No signs of Nausea or vomiting  Post-op Vital Signs: Reviewed and stable  Complications: No apparent anesthesia complications

## 2011-11-28 ENCOUNTER — Telehealth: Payer: Self-pay | Admitting: Gastroenterology

## 2011-11-28 NOTE — Telephone Encounter (Signed)
Pt informed

## 2011-11-28 NOTE — Telephone Encounter (Signed)
Results Cc to PCP  

## 2011-11-28 NOTE — Telephone Encounter (Signed)
Reminder in epic to have tcs in 10 years 

## 2011-11-28 NOTE — Telephone Encounter (Signed)
Please call pt. She had simple adenomas removed from her RECTUM. TCS in 10 years. High fiber diet.  

## 2011-11-29 ENCOUNTER — Encounter (HOSPITAL_COMMUNITY): Payer: Self-pay | Admitting: Gastroenterology

## 2012-01-02 ENCOUNTER — Other Ambulatory Visit: Payer: Self-pay | Admitting: Obstetrics and Gynecology

## 2012-01-02 DIAGNOSIS — R921 Mammographic calcification found on diagnostic imaging of breast: Secondary | ICD-10-CM

## 2012-01-10 ENCOUNTER — Ambulatory Visit
Admission: RE | Admit: 2012-01-10 | Discharge: 2012-01-10 | Disposition: A | Discharge status: Home / Self Care | Payer: 59 | Source: Ambulatory Visit | Attending: Obstetrics and Gynecology | Admitting: Obstetrics and Gynecology

## 2012-01-10 DIAGNOSIS — R921 Mammographic calcification found on diagnostic imaging of breast: Secondary | ICD-10-CM

## 2012-11-05 ENCOUNTER — Other Ambulatory Visit (HOSPITAL_COMMUNITY)
Admission: RE | Admit: 2012-11-05 | Discharge: 2012-11-05 | Disposition: A | Discharge status: Home / Self Care | Payer: 59 | Source: Ambulatory Visit | Attending: Obstetrics and Gynecology | Admitting: Obstetrics and Gynecology

## 2012-11-05 ENCOUNTER — Other Ambulatory Visit: Payer: Self-pay | Admitting: Adult Health

## 2012-11-05 DIAGNOSIS — Z01419 Encounter for gynecological examination (general) (routine) without abnormal findings: Secondary | ICD-10-CM | POA: Insufficient documentation

## 2012-11-05 DIAGNOSIS — Z1151 Encounter for screening for human papillomavirus (HPV): Secondary | ICD-10-CM | POA: Insufficient documentation

## 2012-12-18 ENCOUNTER — Other Ambulatory Visit: Payer: Self-pay | Admitting: Obstetrics and Gynecology

## 2012-12-18 DIAGNOSIS — Z1231 Encounter for screening mammogram for malignant neoplasm of breast: Secondary | ICD-10-CM

## 2013-01-12 ENCOUNTER — Ambulatory Visit: Payer: 59

## 2013-01-15 ENCOUNTER — Ambulatory Visit
Admission: RE | Admit: 2013-01-15 | Discharge: 2013-01-15 | Disposition: A | Discharge status: Home / Self Care | Payer: 59 | Source: Ambulatory Visit | Attending: Obstetrics and Gynecology | Admitting: Obstetrics and Gynecology

## 2013-01-15 DIAGNOSIS — Z1231 Encounter for screening mammogram for malignant neoplasm of breast: Secondary | ICD-10-CM

## 2013-11-11 ENCOUNTER — Other Ambulatory Visit: Payer: Self-pay | Admitting: Adult Health

## 2013-12-14 ENCOUNTER — Other Ambulatory Visit: Payer: Self-pay | Admitting: Adult Health

## 2013-12-29 ENCOUNTER — Encounter: Payer: Self-pay | Admitting: Adult Health

## 2013-12-29 ENCOUNTER — Other Ambulatory Visit: Payer: Self-pay

## 2013-12-29 ENCOUNTER — Encounter (INDEPENDENT_AMBULATORY_CARE_PROVIDER_SITE_OTHER): Payer: Self-pay

## 2013-12-29 ENCOUNTER — Ambulatory Visit (INDEPENDENT_AMBULATORY_CARE_PROVIDER_SITE_OTHER): Payer: 59 | Admitting: Adult Health

## 2013-12-29 VITALS — BP 148/84 | HR 78 | Ht 64.0 in | Wt 217.0 lb

## 2013-12-29 DIAGNOSIS — Z01419 Encounter for gynecological examination (general) (routine) without abnormal findings: Secondary | ICD-10-CM

## 2013-12-29 DIAGNOSIS — Z1231 Encounter for screening mammogram for malignant neoplasm of breast: Secondary | ICD-10-CM

## 2013-12-29 DIAGNOSIS — Z1212 Encounter for screening for malignant neoplasm of rectum: Secondary | ICD-10-CM

## 2013-12-29 LAB — CBC
HCT: 39.7 % (ref 36.0–46.0)
Hemoglobin: 13.3 g/dL (ref 12.0–15.0)
MCH: 31.9 pg (ref 26.0–34.0)
MCHC: 33.5 g/dL (ref 30.0–36.0)
MCV: 95.2 fL (ref 78.0–100.0)
PLATELETS: 302 10*3/uL (ref 150–400)
RBC: 4.17 MIL/uL (ref 3.87–5.11)
RDW: 13.5 % (ref 11.5–15.5)
WBC: 6.3 10*3/uL (ref 4.0–10.5)

## 2013-12-29 LAB — COMPREHENSIVE METABOLIC PANEL
ALT: 18 U/L (ref 0–35)
AST: 15 U/L (ref 0–37)
Albumin: 4.3 g/dL (ref 3.5–5.2)
Alkaline Phosphatase: 80 U/L (ref 39–117)
BILIRUBIN TOTAL: 0.6 mg/dL (ref 0.2–1.2)
BUN: 12 mg/dL (ref 6–23)
CO2: 30 mEq/L (ref 19–32)
CREATININE: 0.69 mg/dL (ref 0.50–1.10)
Calcium: 9.2 mg/dL (ref 8.4–10.5)
Chloride: 101 mEq/L (ref 96–112)
Glucose, Bld: 85 mg/dL (ref 70–99)
Potassium: 3.9 mEq/L (ref 3.5–5.3)
SODIUM: 138 meq/L (ref 135–145)
TOTAL PROTEIN: 7 g/dL (ref 6.0–8.3)

## 2013-12-29 LAB — HEMOCCULT GUIAC POC 1CARD (OFFICE): FECAL OCCULT BLD: NEGATIVE

## 2013-12-29 LAB — LIPID PANEL
CHOL/HDL RATIO: 4.6 ratio
Cholesterol: 218 mg/dL — ABNORMAL HIGH (ref 0–200)
HDL: 47 mg/dL (ref >39)
LDL Cholesterol: 153 mg/dL — ABNORMAL HIGH (ref 0–99)
Triglycerides: 90 mg/dL (ref <150)
VLDL: 18 mg/dL (ref 0–40)

## 2013-12-29 NOTE — Patient Instructions (Signed)
Physical in 1 year Colonoscopy 2023 Mammogram yearly Labs today will call when back Use eucerin lotion for dry skin

## 2013-12-29 NOTE — Progress Notes (Signed)
Patient ID: Kristie Graham, female   DOB: 1966-10-01, 48 y.o.   MRN: [id number] History of Present Illness: [name] is a 48 year old white female,in for a physical, she had a normal pap with negative HPV 11/05/12.  Current Medications, Allergies, Past Medical History, Past Surgical History, Family History and Social History were reviewed in [employer] record.     Review of Systems: Patient denies any headaches, blurred vision, shortness of breath, chest pain, abdominal pain, problems with bowel movements, urination, or intercourse. No joint pain, has noticed some hot flashes esp. At night and less patience.Has had itch left nipple and bottom of stomach feels heavy at times.    Physical Exam:BP 148/84  Pulse 78  Ht 5\' 4"  (1.626 m)  Wt 217 lb (98.431 kg)  BMI 37.23 kg/m2 General:  Well developed, well nourished, no acute distress Skin:  Warm and dry Neck:  Midline trachea, normal thyroid Lungs; Clear to auscultation bilaterally Breast:  No dominant palpable mass, retraction, or nipple discharge, no lesions or skin changes noted  Cardiovascular: Regular rate and rhythm Abdomen:  Soft, non tender, no hepatosplenomegaly Pelvic:  External genitalia is normal in appearance.  The vagina is normal in appearance.The cervix is bulbous.  Uterus is felt to be normal size, shape, and contour.  No  adnexal masses or tenderness noted. Rectal: Good sphincter tone, no polyps,some hemorrhoids felt.  Hemoccult negative. Extremities:  No swelling or varicosities noted Psych:  No mood changes, alert and cooperative, seems happy Got flu shot at work and had colonoscopy 2013 and is good for 10 years per Dr Oneida Alar office Discussed she is peri menopausal, with symptoms and treatment options, she declines any at present.  Impression: Yearly gyn exam no pap    Plan: Physical in 1 year Check CBC,CMP,TSH and lipids Mammogram yearly colonoscopy in 2023 Follow up labs in 24-48 hours Use  Eucerin lotion bid

## 2013-12-30 ENCOUNTER — Telehealth: Payer: Self-pay | Admitting: Adult Health

## 2013-12-30 LAB — TSH: TSH: 0.918 u[IU]/mL (ref 0.350–4.500)

## 2013-12-30 NOTE — Telephone Encounter (Signed)
Left message about labs, diet modifications and exercise

## 2014-01-19 ENCOUNTER — Ambulatory Visit
Admission: RE | Admit: 2014-01-19 | Discharge: 2014-01-19 | Disposition: A | Discharge status: Home / Self Care | Payer: Self-pay | Source: Ambulatory Visit

## 2014-01-19 DIAGNOSIS — Z1231 Encounter for screening mammogram for malignant neoplasm of breast: Secondary | ICD-10-CM

## 2014-08-30 ENCOUNTER — Encounter: Payer: Self-pay | Admitting: Adult Health

## 2014-11-03 ENCOUNTER — Other Ambulatory Visit: Payer: Self-pay

## 2014-11-03 DIAGNOSIS — Z1231 Encounter for screening mammogram for malignant neoplasm of breast: Secondary | ICD-10-CM

## 2014-12-01 ENCOUNTER — Ambulatory Visit (HOSPITAL_COMMUNITY)
Admission: RE | Admit: 2014-12-01 | Discharge: 2014-12-01 | Disposition: A | Discharge status: Home / Self Care | Payer: 59 | Source: Ambulatory Visit | Attending: Physician Assistant | Admitting: Physician Assistant

## 2014-12-01 ENCOUNTER — Other Ambulatory Visit (HOSPITAL_COMMUNITY): Payer: Self-pay | Admitting: Physician Assistant

## 2014-12-01 DIAGNOSIS — M25551 Pain in right hip: Secondary | ICD-10-CM | POA: Insufficient documentation

## 2014-12-01 DIAGNOSIS — G8929 Other chronic pain: Secondary | ICD-10-CM | POA: Insufficient documentation

## 2014-12-01 DIAGNOSIS — M545 Low back pain: Secondary | ICD-10-CM | POA: Diagnosis not present

## 2014-12-07 ENCOUNTER — Emergency Department (HOSPITAL_COMMUNITY)
Admission: EM | Admit: 2014-12-07 | Discharge: 2014-12-07 | Disposition: A | Discharge status: Home / Self Care | Payer: 59 | Attending: Emergency Medicine | Admitting: Emergency Medicine

## 2014-12-07 ENCOUNTER — Encounter (HOSPITAL_COMMUNITY): Payer: Self-pay | Admitting: *Deleted

## 2014-12-07 DIAGNOSIS — G8929 Other chronic pain: Secondary | ICD-10-CM | POA: Diagnosis not present

## 2014-12-07 DIAGNOSIS — R358 Other polyuria: Secondary | ICD-10-CM | POA: Insufficient documentation

## 2014-12-07 DIAGNOSIS — R51 Headache: Secondary | ICD-10-CM | POA: Diagnosis not present

## 2014-12-07 DIAGNOSIS — F419 Anxiety disorder, unspecified: Secondary | ICD-10-CM | POA: Insufficient documentation

## 2014-12-07 DIAGNOSIS — I10 Essential (primary) hypertension: Secondary | ICD-10-CM | POA: Insufficient documentation

## 2014-12-07 DIAGNOSIS — M549 Dorsalgia, unspecified: Secondary | ICD-10-CM | POA: Diagnosis not present

## 2014-12-07 DIAGNOSIS — T50905A Adverse effect of unspecified drugs, medicaments and biological substances, initial encounter: Secondary | ICD-10-CM

## 2014-12-07 DIAGNOSIS — T380X5A Adverse effect of glucocorticoids and synthetic analogues, initial encounter: Secondary | ICD-10-CM | POA: Insufficient documentation

## 2014-12-07 DIAGNOSIS — H538 Other visual disturbances: Secondary | ICD-10-CM | POA: Diagnosis not present

## 2014-12-07 NOTE — ED Notes (Signed)
Pt c/o high blood pressure; pt states she has been on a prednisone dose pack x 5 days ago and has felt weak and tonight at work she took her bp and it was elevated; pt is also c/o unable to focus on objects clearly

## 2014-12-07 NOTE — ED Provider Notes (Signed)
CSN: [id number]     Arrival date & time 12/07/14  0257 History   First MD Initiated Contact with Patient 12/07/14 0405     Chief Complaint  Patient presents with  . Hypertension     (Consider location/radiation/quality/duration/timing/severity/associated sxs/prior Treatment) HPI  Patient reports she was having back pain and she was started on a steroid Dosepak. She states she took the 6 pills a day, then the 5 pills a day, then the 4 pills a day, and then the 3 pills a day was done on February 7. She was only able to take one. She states she felt tired and drained. She had a whole cranial headache that started that same day. She describes as a pressure feeling. She denies a pounding sensation or sharp pain. She has had nausea without vomiting. She's had poly-dipstick and polyuria reporting she has urinated every hour. She states she's having trouble sleeping and feels anxious and fidgety. She also describes pruritus. She states tonight at work her headache got worse and she started having some blurred vision. She states she went to medical and they checked her blood pressure it was 166/106. They sent her to the ED. Patient reports her blood pressure is normally at a normal level. She states she's never been on steroids before.  Family history mother had hypertension and diabetes.  PCP Dr Gerarda Fraction  Past Medical History  Diagnosis Date  . Back pain     chronic  . Complication of anesthesia   . PONV (postoperative nausea and vomiting)   . Anxiety   . Chronic back pain    Past Surgical History  Procedure Laterality Date  . Cholecystectomy  2009  . Tubal ligation  1995  . Endometrial biopsy  2010  . Endometrial ablation  2011    [facility]  . Esophageal biopsy  11/26/2011    Procedure: BIOPSY;  Surgeon: Dorothyann Peng, MD;  Location: [facility];  Service: Endoscopy;;  Rectal polyp biopsy   Family History  Problem Relation Age of Onset  . Prostate cancer Brother     age 51s  . Cancer Father    ?colon, deceased age 64  . Colon cancer Sister     1/2 sister, dx in 72s  . Anesthesia problems Neg Hx   . Hypotension Neg Hx   . Malignant hyperthermia Neg Hx   . Pseudochol deficiency Neg Hx   . Heart disease Mother   . Heart disease Maternal Grandfather    History  Substance Use Topics  . Smoking status: Never Smoker   . Smokeless tobacco: Never Used  . Alcohol Use: Yes     Comment: occassionally   Employed   OB History    Gravida Para Term Preterm AB TAB SAB Ectopic Multiple Living   4 3   1 1    3      Review of Systems  All other systems reviewed and are negative.     Allergies  Sulfa antibiotics  Home Medications   Prior to Admission medications   Medication Sig Start Date End Date Taking? Authorizing Provider  ALPRAZolam Duanne Moron) 1 MG tablet Take 1 mg by mouth daily as needed. Anxiety 11/05/11   Historical Provider, MD  HYDROcodone-acetaminophen (NORCO) 10-325 MG per tablet Take 1 tablet by mouth every 4 (four) hours as needed. Back Pain 11/05/11   Historical Provider, MD  oxyCODONE (ROXICODONE) 15 MG immediate release tablet Take 15 mg by mouth at bedtime as needed. Back Pain 11/06/11   Historical  Provider, MD   BP 165/104 mmHg  Pulse 76  Temp(Src) 98 F (36.7 C) (Oral)  Resp 18  Ht 5\' 3"  (1.6 m)  Wt 202 lb (91.627 kg)  BMI 35.79 kg/m2  SpO2 100%  Vital signs normal except for hypertension  Physical Exam  Constitutional: She is oriented to person, place, and time. She appears well-developed and well-nourished.  Non-toxic appearance. She does not appear ill. No distress.  HENT:  Head: Normocephalic and atraumatic.  Right Ear: External ear normal.  Left Ear: External ear normal.  Nose: Nose normal. No mucosal edema or rhinorrhea.  Mouth/Throat: Oropharynx is clear and moist and mucous membranes are normal. No dental abscesses or uvula swelling.  Eyes: Conjunctivae and EOM are normal. Pupils are equal, round, and reactive to light.  Neck: Normal range of  motion and full passive range of motion without pain. Neck supple.  Cardiovascular: Normal rate, regular rhythm and normal heart sounds.  Exam reveals no gallop and no friction rub.   No murmur heard. Pulmonary/Chest: Effort normal and breath sounds normal. No respiratory distress. She has no wheezes. She has no rhonchi. She has no rales. She exhibits no tenderness and no crepitus.  Abdominal: Soft. Normal appearance and bowel sounds are normal. She exhibits no distension. There is no tenderness. There is no rebound and no guarding.  Musculoskeletal: Normal range of motion. She exhibits no edema or tenderness.  Moves all extremities well.   Neurological: She is alert and oriented to person, place, and time. She has normal strength. No cranial nerve deficit.  Skin: Skin is warm, dry and intact. No rash noted. No erythema. No pallor.  Psychiatric: She has a normal mood and affect. Her speech is normal and behavior is normal. Her mood appears not anxious.  Nursing note and vitals reviewed.   ED Course  Procedures (including critical care time)   We discussed patient's symptoms are all consistent with side effects of the prednisone. She had already stopped her medication about 24-36 hours ago. She needs to give it a little more time for the symptoms to totally resolve. Her CBC was checked to make sure she did not have a hyperglycemia from the prednisone. Her blood pressure was not treated. It should resolve spontaneously on its own. She said her headache was improved while she was waiting to be seen in the ED. She states she did not need treatment for her headache. She also reports she's been drinking lots of Gatorade to keep herself hydrated.   Labs Review CBG 99 reported by nurse   Dg Hip Unilat With Pelvis 2-3 Views Right  12/01/2014   CLINICAL DATA:  Two months of right low the hip and leg pain without known injury; chronic low back pain.  EXAM: RIGHT HIP (WITH PELVIS) 2-3 VIEWS  COMPARISON:   Reconstructed images through the pelvis and right hip from an abdominal and pelvic CT scan of May 01, 2010.  FINDINGS: The bony pelvis is adequately mineralized. The hip joint spaces are preserved. AP and frog-leg lateral views of the right hip reveal no acute abnormality of the joint components. The femoral head and acetabular cup exhibit a rounded articular contour. The overlying soft tissues are normal.  IMPRESSION: There is no acute or significant chronic bony abnormality of the right hip.   Electronically Signed   By: David  Martinique   On: 12/01/2014 13:10           Imaging Review No results found.   EKG Interpretation  None      MDM   Final diagnoses:  Medication side effect, initial encounter     Plan discharge  Rolland Porter, MD, Alanson Aly, MD 12/07/14 [phone]

## 2014-12-07 NOTE — Discharge Instructions (Signed)
I gave you a list of possible side effects of taking prednisone and most of your symptoms are there. Let your doctor know that you are sensitive to it and try to avoid it if possible in the future. Have your doctor recheck your blood pressure next week to make sure it has gone back down to normal.

## 2014-12-08 LAB — CBG MONITORING, ED: GLUCOSE-CAPILLARY: 99 mg/dL (ref 70–99)

## 2015-01-04 ENCOUNTER — Encounter: Payer: Self-pay | Admitting: Adult Health

## 2015-01-04 ENCOUNTER — Ambulatory Visit (INDEPENDENT_AMBULATORY_CARE_PROVIDER_SITE_OTHER): Payer: 59 | Admitting: Adult Health

## 2015-01-04 VITALS — BP 130/80 | HR 72 | Ht 64.0 in | Wt 217.0 lb

## 2015-01-04 DIAGNOSIS — Z01419 Encounter for gynecological examination (general) (routine) without abnormal findings: Secondary | ICD-10-CM

## 2015-01-04 DIAGNOSIS — Z1212 Encounter for screening for malignant neoplasm of rectum: Secondary | ICD-10-CM

## 2015-01-04 LAB — HEMOCCULT GUIAC POC 1CARD (OFFICE): Fecal Occult Blood, POC: NEGATIVE

## 2015-01-04 NOTE — Progress Notes (Signed)
Patient ID: Kristie Graham, female   DOB: 1966-09-07, 49 y.o.   MRN: [id number] History of Present Illness: Kristie Graham is a 49 year old white female in for well woman gyn exam.Had normal pap with negative HPV 11/05/12.She says had recently episode of elevated BP and was placed on losartin, has gained 15 lbs,talk with PCP.   Current Medications, Allergies, Past Medical History, Past Surgical History, Family History and Social History were reviewed in [employer] record.     Review of Systems: Patient denies any headaches, hearing loss, fatigue, blurred vision, shortness of breath, chest pain, abdominal pain, problems with bowel movements, urination, or intercourse. No joint pain or mood swings.Stressed at work. No bleeding happy with ablation.   Physical Exam:BP 130/80 mmHg  Pulse 72  Ht 5\' 4"  (1.626 m)  Wt 217 lb (98.431 kg)  BMI 37.23 kg/m2 General:  Well developed, well nourished, no acute distress Skin:  Warm and dry Neck:  Midline trachea, normal thyroid, good ROM, no lymphadenopathy Lungs; Clear to auscultation bilaterally Breast:  No dominant palpable mass, retraction, or nipple discharge Cardiovascular: Regular rate and rhythm Abdomen:  Soft, non tender, no hepatosplenomegaly Pelvic:  External genitalia is normal in appearance, no lesions.  The vagina is normal in appearance,has good color, moisture and rugae. Urethra has no lesions or masses. The cervix is bulbous.  Uterus is felt to be normal size, shape, and contour.  No adnexal masses or tenderness noted.Bladder is non tender, no masses felt. Rectal: Good sphincter tone, no polyps, or hemorrhoids felt.  Hemoccult negative. Extremities/musculoskeletal:  No swelling or varicosities noted, no clubbing or cyanosis Psych:  No mood changes, alert and cooperative,seems happy   Impression: Well woman gyn exam  No pap    Plan: Pap and physical in 1 year Mammogram yearly Labs with PCP Colonoscopy at 49

## 2015-01-04 NOTE — Patient Instructions (Signed)
Pap and physical in `1 year Mammogram yearly Talk with pcp

## 2015-01-21 ENCOUNTER — Ambulatory Visit: Payer: Self-pay

## 2015-01-24 ENCOUNTER — Ambulatory Visit: Payer: Self-pay

## 2015-01-27 ENCOUNTER — Ambulatory Visit
Admission: RE | Admit: 2015-01-27 | Discharge: 2015-01-27 | Disposition: A | Discharge status: Home / Self Care | Payer: 59 | Source: Ambulatory Visit

## 2015-01-27 ENCOUNTER — Encounter (INDEPENDENT_AMBULATORY_CARE_PROVIDER_SITE_OTHER): Payer: Self-pay

## 2015-01-27 DIAGNOSIS — Z1231 Encounter for screening mammogram for malignant neoplasm of breast: Secondary | ICD-10-CM

## 2015-05-16 ENCOUNTER — Telehealth: Payer: Self-pay | Admitting: Adult Health

## 2015-05-16 NOTE — Telephone Encounter (Signed)
Has hemorrhoids, and has had some bleeding, to try preparation H or anusol if not better, make appt, had colonoscopy in 2013

## 2015-12-20 ENCOUNTER — Other Ambulatory Visit: Payer: Self-pay

## 2015-12-20 DIAGNOSIS — Z1231 Encounter for screening mammogram for malignant neoplasm of breast: Secondary | ICD-10-CM

## 2016-01-06 ENCOUNTER — Encounter: Payer: Self-pay | Admitting: Adult Health

## 2016-01-06 ENCOUNTER — Ambulatory Visit (INDEPENDENT_AMBULATORY_CARE_PROVIDER_SITE_OTHER): Payer: Commercial Managed Care - HMO | Admitting: Adult Health

## 2016-01-06 ENCOUNTER — Other Ambulatory Visit (HOSPITAL_COMMUNITY)
Admission: RE | Admit: 2016-01-06 | Discharge: 2016-01-06 | Disposition: A | Discharge status: Home / Self Care | Payer: Commercial Managed Care - HMO | Source: Ambulatory Visit | Attending: Adult Health | Admitting: Adult Health

## 2016-01-06 VITALS — BP 140/80 | HR 88 | Ht 63.5 in | Wt 213.0 lb

## 2016-01-06 DIAGNOSIS — Z1151 Encounter for screening for human papillomavirus (HPV): Secondary | ICD-10-CM | POA: Diagnosis present

## 2016-01-06 DIAGNOSIS — G44219 Episodic tension-type headache, not intractable: Secondary | ICD-10-CM

## 2016-01-06 DIAGNOSIS — Z01411 Encounter for gynecological examination (general) (routine) with abnormal findings: Secondary | ICD-10-CM | POA: Insufficient documentation

## 2016-01-06 DIAGNOSIS — Z01419 Encounter for gynecological examination (general) (routine) without abnormal findings: Secondary | ICD-10-CM

## 2016-01-06 DIAGNOSIS — E049 Nontoxic goiter, unspecified: Secondary | ICD-10-CM

## 2016-01-06 DIAGNOSIS — G44209 Tension-type headache, unspecified, not intractable: Secondary | ICD-10-CM | POA: Insufficient documentation

## 2016-01-06 DIAGNOSIS — Z1212 Encounter for screening for malignant neoplasm of rectum: Secondary | ICD-10-CM | POA: Diagnosis not present

## 2016-01-06 HISTORY — DX: Nontoxic goiter, unspecified: E04.9

## 2016-01-06 HISTORY — DX: Tension-type headache, unspecified, not intractable: G44.209

## 2016-01-06 LAB — HEMOCCULT GUIAC POC 1CARD (OFFICE): FECAL OCCULT BLD: NEGATIVE

## 2016-01-06 MED ORDER — CYCLOBENZAPRINE HCL 5 MG PO TABS: 5.0000 mg | ORAL_TABLET | Freq: Three times a day (TID) | ORAL | Status: DC | PRN

## 2016-01-06 NOTE — Progress Notes (Signed)
Patient ID: Kristie Graham, female   DOB: [birth year], 50 y.o.   MRN: [id number] History of Present Illness: Kristie Graham is a 50 year old white female, in for well woman gyn exam and pap.She works nights and is complaining of more headaches, from neck up to front of head, motrin helps.Just this week she was treated for kidney stones by Suezanne Jacquet at [facility] also said she was treated recently for inflammation of thyroid she thinks. PCP is Dr Gerarda Fraction.  Current Medications, Allergies, Past Medical History, Past Surgical History, Family History and Social History were reviewed in [facility] record.     Review of Systems:  Patient denies any hearing loss, fatigue, blurred vision, shortness of breath, chest pain, problems with bowel movements, urination, or intercourse. No joint pain or mood swings.See HPI for positives.   Physical Exam:BP 140/80 mmHg  Pulse 88  Ht 5' 3.5" (1.613 m)  Wt 213 lb (96.616 kg)  BMI 37.13 kg/m2 General:  Well developed, well nourished, no acute distress Skin:  Warm and dry Neck:  Midline trachea, enlarged thyroid, good ROM, no lymphadenopathy Lungs; Clear to auscultation bilaterally Breast:  No dominant palpable mass, retraction, or nipple discharge Cardiovascular: Regular rate and rhythm Abdomen:  Soft, mildly tender, no hepatosplenomegaly Pelvic:  External genitalia is normal in appearance, no lesions.  The vagina is normal in appearance. Urethra has no lesions or masses. The cervix is bulbous. Pap with HPV performed.  Uterus is felt to be normal size, shape, and contour.  No adnexal masses or tenderness noted.Bladder is non tender, no masses felt. Rectal: Good sphincter tone, no polyps, or hemorrhoids felt.  Hemoccult negative. Extremities/musculoskeletal:  No swelling or varicosities noted, no clubbing or cyanosis Psych:  No mood changes, alert and cooperative,seems happy   Impression: Well woman gyn exam and pap Tension  headache Enlarged thyroid   Plan: Thyroid US 3/15 at 8:30 am at [facility] Rx flexeril 5 mg #30 take 1 every 8 hours prn pain and use ice to back of neck Physical in 1 year, pap in 3 if negative Check CBC,CMP,TSH and lipids, A1c and vitamin D Mammogram yearly  Will talk when labs and [place] results back

## 2016-01-06 NOTE — Patient Instructions (Signed)
Physical in 1 year, pap in 3 if normal Mammogram yearly Get thyroid US 3/15 at 8:15am  Try flexeril and ice to neck

## 2016-01-07 LAB — CBC
Hematocrit: 41.1 % (ref 34.0–46.6)
Hemoglobin: 13.4 g/dL (ref 11.1–15.9)
MCH: 31.3 pg (ref 26.6–33.0)
MCHC: 32.6 g/dL (ref 31.5–35.7)
MCV: 96 fL (ref 79–97)
PLATELETS: 346 10*3/uL (ref 150–379)
RBC: 4.28 x10E6/uL (ref 3.77–5.28)
RDW: 13.4 % (ref 12.3–15.4)
WBC: 7.8 10*3/uL (ref 3.4–10.8)

## 2016-01-07 LAB — COMPREHENSIVE METABOLIC PANEL
ALT: 14 IU/L (ref 0–32)
AST: 12 IU/L (ref 0–40)
Albumin/Globulin Ratio: 1.4 (ref 1.1–2.5)
Albumin: 4.5 g/dL (ref 3.5–5.5)
Alkaline Phosphatase: 86 IU/L (ref 39–117)
BUN/Creatinine Ratio: 19 (ref 9–23)
BUN: 15 mg/dL (ref 6–24)
Bilirubin Total: 0.3 mg/dL (ref 0.0–1.2)
CALCIUM: 9.3 mg/dL (ref 8.7–10.2)
CO2: 23 mmol/L (ref 18–29)
Chloride: 98 mmol/L (ref 96–106)
Creatinine, Ser: 0.8 mg/dL (ref 0.57–1.00)
GFR calc Af Amer: 102 mL/min/{1.73_m2} (ref >59)
GFR, EST NON AFRICAN AMERICAN: 88 mL/min/{1.73_m2} (ref >59)
Globulin, Total: 3.3 g/dL (ref 1.5–4.5)
Glucose: 103 mg/dL — ABNORMAL HIGH (ref 65–99)
POTASSIUM: 3.6 mmol/L (ref 3.5–5.2)
Sodium: 140 mmol/L (ref 134–144)
Total Protein: 7.8 g/dL (ref 6.0–8.5)

## 2016-01-07 LAB — HEMOGLOBIN A1C
Est. average glucose Bld gHb Est-mCnc: 108 mg/dL
Hgb A1c MFr Bld: 5.4 % (ref 4.8–5.6)

## 2016-01-07 LAB — LIPID PANEL
Chol/HDL Ratio: 4.2 ratio units (ref 0.0–4.4)
Cholesterol, Total: 207 mg/dL — ABNORMAL HIGH (ref 100–199)
HDL: 49 mg/dL (ref >39)
LDL Calculated: 139 mg/dL — ABNORMAL HIGH (ref 0–99)
TRIGLYCERIDES: 95 mg/dL (ref 0–149)
VLDL Cholesterol Cal: 19 mg/dL (ref 5–40)

## 2016-01-07 LAB — VITAMIN D 25 HYDROXY (VIT D DEFICIENCY, FRACTURES): Vit D, 25-Hydroxy: 16 ng/mL — ABNORMAL LOW (ref 30.0–100.0)

## 2016-01-07 LAB — TSH: TSH: 1.23 u[IU]/mL (ref 0.450–4.500)

## 2016-01-10 ENCOUNTER — Telehealth: Payer: Self-pay | Admitting: Adult Health

## 2016-01-10 ENCOUNTER — Encounter: Payer: Self-pay | Admitting: Adult Health

## 2016-01-10 DIAGNOSIS — E559 Vitamin D deficiency, unspecified: Secondary | ICD-10-CM

## 2016-01-10 HISTORY — DX: Vitamin D deficiency, unspecified: E55.9

## 2016-01-10 LAB — CYTOLOGY - PAP

## 2016-01-10 MED ORDER — CHOLECALCIFEROL 125 MCG (5000 UT) PO CAPS: 5000.0000 [IU] | ORAL_CAPSULE | Freq: Every day | ORAL | Status: DC

## 2016-01-10 NOTE — Telephone Encounter (Signed)
Pt aware of labs and need to take vitamin D3 5000 iu per day

## 2016-01-11 ENCOUNTER — Telehealth: Payer: Self-pay | Admitting: Adult Health

## 2016-01-11 ENCOUNTER — Ambulatory Visit (HOSPITAL_COMMUNITY)
Admission: RE | Admit: 2016-01-11 | Discharge: 2016-01-11 | Disposition: A | Discharge status: Home / Self Care | Payer: Commercial Managed Care - HMO | Source: Ambulatory Visit | Attending: Adult Health | Admitting: Adult Health

## 2016-01-11 DIAGNOSIS — E049 Nontoxic goiter, unspecified: Secondary | ICD-10-CM | POA: Insufficient documentation

## 2016-01-11 NOTE — Telephone Encounter (Signed)
Pt aware US normal  

## 2016-01-12 ENCOUNTER — Telehealth: Payer: Self-pay | Admitting: Adult Health

## 2016-01-12 NOTE — Telephone Encounter (Signed)
Received hard copy of pap today, is not in [facility] yet, and it shows ASC-H no HPV, she is aware that needs colpo appt, to make with Dr Glo Herring

## 2016-01-25 ENCOUNTER — Ambulatory Visit (INDEPENDENT_AMBULATORY_CARE_PROVIDER_SITE_OTHER): Payer: Commercial Managed Care - HMO | Admitting: Obstetrics and Gynecology

## 2016-01-25 ENCOUNTER — Other Ambulatory Visit: Payer: Self-pay | Admitting: Obstetrics and Gynecology

## 2016-01-25 ENCOUNTER — Encounter: Payer: Self-pay | Admitting: Obstetrics and Gynecology

## 2016-01-25 VITALS — BP 122/84 | Ht 63.0 in | Wt 212.0 lb

## 2016-01-25 DIAGNOSIS — R87610 Atypical squamous cells of undetermined significance on cytologic smear of cervix (ASC-US): Secondary | ICD-10-CM | POA: Insufficient documentation

## 2016-01-25 DIAGNOSIS — IMO0002 Reserved for concepts with insufficient information to code with codable children: Secondary | ICD-10-CM | POA: Insufficient documentation

## 2016-01-25 DIAGNOSIS — R87611 Atypical squamous cells cannot exclude high grade squamous intraepithelial lesion on cytologic smear of cervix (ASC-H): Secondary | ICD-10-CM

## 2016-01-25 NOTE — Progress Notes (Addendum)
Patient ID: Haywood Pao, female   DOB: 06-09-1966, 50 y.o.   MRN: [id number]   TERRISHA ENEVOLDSEN 50 y.o. [id number] here for colposcopy for ASC cannot exclude high grade lesion [facility]) pap smear on 01/06/16.  Discussed role for HPV in cervical dysplasia, need for surveillance.  Patient given informed consent, signed copy in the chart, time out was performed.  Placed in lithotomy position. Cervix viewed with speculum and colposcope after application of acetic acid.   Colposcopy adequate? No  Cervix has smooth outer surface, no visible dysplasia,  no visible lesions, no mosaicism, no punctation and no abnormal vasculature; biopsies obtained at n/a.   ECC specimen obtained. Specimen was labelled and sent to pathology.  Colposcopy IMPRESSIONno visible dysplasia,:  Patient was given post procedure instructions. Will follow up pathology and manage accordingly.  Results by MyChart.   Routine preventative health maintenance measures emphasized.  By signing my name below, I, Stephania Fragmin, attest that this documentation has been prepared under the direction and in the presence of Jonnie Kind, MD. Electronically Signed: Stephania Fragmin, ED Scribe. 01/25/2016. 9:50 AM.  I personally performed the services described in this documentation, which was SCRIBED in my presence. The recorded information has been reviewed and considered accurate. It has been edited as necessary during review. Jonnie Kind, MD

## 2016-01-25 NOTE — Progress Notes (Signed)
Patient ID: Kristie Graham, female   DOB: Jan 03, 1966, 50 y.o.   MRN: [id number] Pt here today for a colposcopy.

## 2016-01-30 ENCOUNTER — Ambulatory Visit: Payer: Self-pay

## 2016-04-02 ENCOUNTER — Ambulatory Visit: Payer: Commercial Managed Care - HMO

## 2016-04-26 ENCOUNTER — Ambulatory Visit
Admission: RE | Admit: 2016-04-26 | Discharge: 2016-04-26 | Disposition: A | Discharge status: Home / Self Care | Payer: Commercial Managed Care - HMO | Source: Ambulatory Visit

## 2016-04-26 DIAGNOSIS — Z1231 Encounter for screening mammogram for malignant neoplasm of breast: Secondary | ICD-10-CM

## 2016-05-03 ENCOUNTER — Telehealth: Payer: Self-pay | Admitting: Adult Health

## 2016-05-03 NOTE — Telephone Encounter (Signed)
Spoke with pt letting her know mammogram looked good. Screening mammo recommended in 1 year. Pt voiced understanding. [place]

## 2016-05-03 NOTE — Telephone Encounter (Signed)
Pt called stating that she went to have her mammogram done at the [facility] and she has not heard anything back regarding her results, Pt would like a call back from [place]. Please contact pt

## 2016-06-18 ENCOUNTER — Other Ambulatory Visit: Payer: Self-pay | Admitting: Emergency Medicine

## 2016-06-18 ENCOUNTER — Ambulatory Visit
Admission: RE | Admit: 2016-06-18 | Discharge: 2016-06-18 | Disposition: A | Discharge status: Home / Self Care | Payer: Worker's Compensation | Source: Ambulatory Visit | Attending: Emergency Medicine | Admitting: Emergency Medicine

## 2016-06-18 DIAGNOSIS — W19XXXA Unspecified fall, initial encounter: Secondary | ICD-10-CM

## 2016-06-18 DIAGNOSIS — T148XXA Other injury of unspecified body region, initial encounter: Secondary | ICD-10-CM

## 2017-01-09 ENCOUNTER — Other Ambulatory Visit: Payer: Self-pay | Admitting: Obstetrics and Gynecology

## 2017-01-09 DIAGNOSIS — Z1231 Encounter for screening mammogram for malignant neoplasm of breast: Secondary | ICD-10-CM

## 2017-01-24 ENCOUNTER — Other Ambulatory Visit (HOSPITAL_COMMUNITY)
Admission: RE | Admit: 2017-01-24 | Discharge: 2017-01-24 | Disposition: A | Discharge status: Home / Self Care | Payer: Commercial Managed Care - HMO | Source: Ambulatory Visit | Attending: Advanced Practice Midwife | Admitting: Advanced Practice Midwife

## 2017-01-24 ENCOUNTER — Ambulatory Visit (INDEPENDENT_AMBULATORY_CARE_PROVIDER_SITE_OTHER): Payer: Commercial Managed Care - HMO | Admitting: Advanced Practice Midwife

## 2017-01-24 ENCOUNTER — Encounter: Payer: Self-pay | Admitting: Advanced Practice Midwife

## 2017-01-24 VITALS — BP 134/86 | HR 84 | Ht 63.75 in | Wt 205.0 lb

## 2017-01-24 DIAGNOSIS — Z01411 Encounter for gynecological examination (general) (routine) with abnormal findings: Secondary | ICD-10-CM | POA: Insufficient documentation

## 2017-01-24 DIAGNOSIS — Z1212 Encounter for screening for malignant neoplasm of rectum: Secondary | ICD-10-CM | POA: Diagnosis not present

## 2017-01-24 DIAGNOSIS — Z1151 Encounter for screening for human papillomavirus (HPV): Secondary | ICD-10-CM | POA: Insufficient documentation

## 2017-01-24 DIAGNOSIS — Z01419 Encounter for gynecological examination (general) (routine) without abnormal findings: Secondary | ICD-10-CM | POA: Diagnosis not present

## 2017-01-24 DIAGNOSIS — Z13220 Encounter for screening for lipoid disorders: Secondary | ICD-10-CM

## 2017-01-24 DIAGNOSIS — R5383 Other fatigue: Secondary | ICD-10-CM

## 2017-01-24 NOTE — Progress Notes (Signed)
Kristie Graham 51 y.o.  Vitals:   01/24/17 0838  BP: 134/86  Pulse: 84     Filed Weights   01/24/17 0838  Weight: 205 lb (93 kg)    Past Medical History: Past Medical History:  Diagnosis Date  . Anxiety   . Back pain    chronic  . Chronic back pain   . Chronic kidney disease    kidney stones  . Complication of anesthesia   . Enlarged thyroid 01/06/2016  . Hypertension   . PONV (postoperative nausea and vomiting)   . Tension type headache 01/06/2016  . Vaginal Pap smear, abnormal   . Vitamin D deficiency 01/10/2016    Past Surgical History: Past Surgical History:  Procedure Laterality Date  . BIOPSY  11/26/2011   Procedure: BIOPSY;  Surgeon: Dorothyann Peng, MD;  Location: [facility];  Service: Endoscopy;;  Rectal polyp biopsy  . CHOLECYSTECTOMY  2009  . ENDOMETRIAL ABLATION  2011   [facility]  . ENDOMETRIAL BIOPSY  2010  . TUBAL LIGATION  1995    Family History: Family History  Problem Relation Age of Onset  . Prostate cancer Brother     age 36s  . Cancer Father     ?colon, deceased age 19  . Colon cancer Sister     1/2 sister, dx in 52s  . Heart disease Mother   . Heart disease Maternal Grandfather   . Anesthesia problems Neg Hx   . Hypotension Neg Hx   . Malignant hyperthermia Neg Hx   . Pseudochol deficiency Neg Hx     Social History: Social History  Substance Use Topics  . Smoking status: Never Smoker  . Smokeless tobacco: Never Used  . Alcohol use Yes     Comment: occassionally    Allergies:  Allergies  Allergen Reactions  . Prednisone Other (See Comments)    Headaches, vision problems  . Sulfa Antibiotics Hives and Itching    headaches      Current Outpatient Prescriptions:  .  ALPRAZolam (XANAX) 1 MG tablet, Take 1 mg by mouth daily as needed. Anxiety, Disp: , Rfl:  .  Cholecalciferol 5000 units capsule, Take 1 capsule (5,000 Units total) by mouth daily., Disp: , Rfl:  .  losartan (COZAAR) 25 MG tablet, 25 mg daily. , Disp: , Rfl:  .   cyclobenzaprine (FLEXERIL) 5 MG tablet, Take 1 tablet (5 mg total) by mouth 3 (three) times daily as needed for muscle spasms. (Patient not taking: Reported on 01/24/2017), Disp: 30 tablet, Rfl: 0 .  HYDROcodone-acetaminophen (NORCO) 10-325 MG per tablet, Take 1 tablet by mouth every 4 (four) hours as needed. Back Pain, Disp: , Rfl:   History of Present Illness: Here for pap and physical.  Pap last year was ASCUS-H, colpo mild CIN1.  Had colonoscopy a few years ago (benign polyps).  Has had stomach pain asso w/BM and visible blood in stool for a few months. Mammogram 6/17 normal   Review of Systems   Patient denies any headaches, blurred vision, shortness of breath, chest pain, abdominal pain, problems with bowel movements, urination, or intercourse.   Physical Exam: General:  Well developed, well nourished, no acute distress Skin:  Warm and dry Neck:  Midline trachea, normal thyroid Lungs; Clear to auscultation bilaterally Breast:  No dominant palpable mass, retraction, or nipple discharge Cardiovascular: Regular rate and rhythm Abdomen:  Soft, non tender, no hepatosplenomegaly Pelvic:  External genitalia is normal in appearance.  The vagina is normal in appearance.  The cervix is bulbous.  Uterus is felt to be normal size, shape, and contour.  No adnexal masses or tenderness noted. Rectal:  No hemorroids visible/palpated.  Hemocult +  Extremities:  No swelling or varicosities noted Psych:  No mood changes.     Impression: normal GYN exam Hemocult +    Plan: Make appt w/GI MD; hemocult cards sent home w/pt (can get GI to develop) If pap normal, get 2 more yearly normals before going to q 3 years  Screening labs

## 2017-01-25 LAB — TSH: TSH: 1.31 u[IU]/mL (ref 0.450–4.500)

## 2017-01-25 LAB — CBC
HEMATOCRIT: 37.6 % (ref 34.0–46.6)
Hemoglobin: 12.8 g/dL (ref 11.1–15.9)
MCH: 31.3 pg (ref 26.6–33.0)
MCHC: 34 g/dL (ref 31.5–35.7)
MCV: 92 fL (ref 79–97)
Platelets: 319 10*3/uL (ref 150–379)
RBC: 4.09 x10E6/uL (ref 3.77–5.28)
RDW: 13.7 % (ref 12.3–15.4)
WBC: 6 10*3/uL (ref 3.4–10.8)

## 2017-01-25 LAB — LIPID PANEL
CHOL/HDL RATIO: 4 ratio (ref 0.0–4.4)
Cholesterol, Total: 202 mg/dL — ABNORMAL HIGH (ref 100–199)
HDL: 50 mg/dL (ref >39)
LDL Calculated: 137 mg/dL — ABNORMAL HIGH (ref 0–99)
Triglycerides: 74 mg/dL (ref 0–149)
VLDL CHOLESTEROL CAL: 15 mg/dL (ref 5–40)

## 2017-01-25 LAB — COMPREHENSIVE METABOLIC PANEL
ALBUMIN: 4.1 g/dL (ref 3.5–5.5)
ALT: 13 IU/L (ref 0–32)
AST: 13 IU/L (ref 0–40)
Albumin/Globulin Ratio: 1.4 (ref 1.2–2.2)
Alkaline Phosphatase: 93 IU/L (ref 39–117)
BILIRUBIN TOTAL: 0.4 mg/dL (ref 0.0–1.2)
BUN / CREAT RATIO: 15 (ref 9–23)
BUN: 12 mg/dL (ref 6–24)
CHLORIDE: 101 mmol/L (ref 96–106)
CO2: 23 mmol/L (ref 18–29)
CREATININE: 0.79 mg/dL (ref 0.57–1.00)
Calcium: 9.3 mg/dL (ref 8.7–10.2)
GFR calc non Af Amer: 89 mL/min/{1.73_m2} (ref >59)
GFR, EST AFRICAN AMERICAN: 102 mL/min/{1.73_m2} (ref >59)
GLOBULIN, TOTAL: 3 g/dL (ref 1.5–4.5)
Glucose: 90 mg/dL (ref 65–99)
Potassium: 4 mmol/L (ref 3.5–5.2)
Sodium: 142 mmol/L (ref 134–144)
TOTAL PROTEIN: 7.1 g/dL (ref 6.0–8.5)

## 2017-01-28 ENCOUNTER — Encounter: Payer: Self-pay | Admitting: Gastroenterology

## 2017-01-28 ENCOUNTER — Other Ambulatory Visit: Payer: Self-pay

## 2017-01-28 ENCOUNTER — Ambulatory Visit (INDEPENDENT_AMBULATORY_CARE_PROVIDER_SITE_OTHER): Payer: Self-pay | Admitting: Gastroenterology

## 2017-01-28 ENCOUNTER — Telehealth: Payer: Self-pay

## 2017-01-28 VITALS — BP 135/97 | HR 84 | Temp 97.8°F | Ht 63.0 in | Wt 205.0 lb

## 2017-01-28 DIAGNOSIS — K219 Gastro-esophageal reflux disease without esophagitis: Secondary | ICD-10-CM

## 2017-01-28 DIAGNOSIS — K625 Hemorrhage of anus and rectum: Secondary | ICD-10-CM

## 2017-01-28 DIAGNOSIS — R195 Other fecal abnormalities: Secondary | ICD-10-CM | POA: Insufficient documentation

## 2017-01-28 DIAGNOSIS — Z800 Family history of malignant neoplasm of digestive organs: Secondary | ICD-10-CM

## 2017-01-28 LAB — CYTOLOGY - PAP
Diagnosis: NEGATIVE
HPV (WINDOPATH): NOT DETECTED

## 2017-01-28 MED ORDER — NA SULFATE-K SULFATE-MG SULF 17.5-3.13-1.6 GM/177ML PO SOLN: 1.0000 | ORAL | 0 refills | Status: DC

## 2017-01-28 NOTE — Telephone Encounter (Signed)
Called and informed pt of pre-op appt 02/08/17 at 2:45pm. Letter also mailed.

## 2017-01-28 NOTE — Progress Notes (Addendum)
REVIEWED-NO ADDITIONAL RECOMMENDATIONS.  Primary Care Physician:  Glo Herring, MD  Primary Gastroenterologist:  Barney Drain, MD   Chief Complaint  Patient presents with  . Rectal Bleeding    family hx cancer  . Abdominal Pain    lower abd  . Nausea  . Gastroesophageal Reflux    HPI:  Kristie Graham is a 51 y.o. female here for further evaluation of rectal bleeding, Hemoccult-positive stool.  She recently went for her GYN exam last week and was heme positive on rectal exam. She was advised to follow-up with [place]. She underwent colonoscopy in January 2013 for rectal bleeding. She had multiple polyps in the rectum, pathology revealed tubular adenoma. Rare diverticula in the sigmoid colon. Small internal hemorrhoids seen.  Patient's family history significant for suspected colon cancer, father. Deceased within days of diagnosis of metastatic cancer at age 67. Her half sister died in her 70s with colon cancer.  Patient states that she has an uncomfortable feeling in her rectum. Not really painful but doesn't feel normal. Tends to be exacerbated if she has multiple bowel movements within one day. She has intermittent bright red blood per rectum with passage of small blood clots at times. No melena. Complains of postprandial fecal urgency with loose stool. Usually several bowel movements per day. Bowel movements usually preceded by excruciating abdominal cramping. After bowel movement, her pain improves and she is left with "soreness". Denies vomiting.  She has some intermittent heartburn years. Over-the-counter antacids seem to help. She has sensation of globus or need to clear her throat frequently. She wonders if this is allergies. These symptoms are all worse with certain foods. Denies dysphagia. No unintentional weight loss.   Current Outpatient Prescriptions  Medication Sig Dispense Refill  . ALPRAZolam (XANAX) 1 MG tablet Take 1 mg by mouth daily as needed. Anxiety    .  Cholecalciferol 5000 units capsule Take 1 capsule (5,000 Units total) by mouth daily.    Marland Kitchen losartan (COZAAR) 25 MG tablet 25 mg daily.      No current facility-administered medications for this visit.     Allergies as of 01/28/2017 - Review Complete 01/28/2017  Allergen Reaction Noted  . Prednisone Other (See Comments) 01/04/2015  . Sulfa antibiotics Hives and Itching 12/29/2013    Past Medical History:  Diagnosis Date  . Anxiety   . Back pain    chronic  . Chronic back pain   . Chronic kidney disease    kidney stones  . Complication of anesthesia   . Enlarged thyroid 01/06/2016  . Hypertension   . PONV (postoperative nausea and vomiting)   . Tension type headache 01/06/2016  . Vaginal Pap smear, abnormal   . Vitamin D deficiency 01/10/2016    Past Surgical History:  Procedure Laterality Date  . BIOPSY  11/26/2011   Procedure: BIOPSY;  Surgeon: Dorothyann Peng, MD;  Location: [facility];  Service: Endoscopy;;  Rectal polyp biopsy  . CHOLECYSTECTOMY  2009  . COLONOSCOPY  10/2011   Dr. Oneida Alar: [facility]. Multiple polyps in the rectum, tubular adenoma. Diverticulosis sigmoid colon, internal hemorrhoids.  . ENDOMETRIAL ABLATION  2011   [facility]  . ENDOMETRIAL BIOPSY  2010  . TUBAL LIGATION  1995    Family History  Problem Relation Age of Onset  . Prostate cancer Brother     age 38s  . Cancer Father     ?colon, deceased age 45  . Colon cancer Sister     1/2 sister, dx in 52s  .  Heart disease Mother   . Heart disease Maternal Grandfather   . Anesthesia problems Neg Hx   . Hypotension Neg Hx   . Malignant hyperthermia Neg Hx   . Pseudochol deficiency Neg Hx     Social History   Social History  . Marital status: Divorced    Spouse name: N/A  . Number of children: N/A  . Years of education: N/A   Occupational History  . Not on file.   Social History Main Topics  . Smoking status: Never Smoker  . Smokeless tobacco: Never Used  . Alcohol use Yes     Comment: occassionally   . Drug use: No  . Sexual activity: Yes    Birth control/ protection: Surgical     Comment:  tubal and ablation   Other Topics Concern  . Not on file   Social History Narrative  . No narrative on file      ROS:  General: Negative for anorexia, weight loss, fever, chills, fatigue, weakness. Eyes: Negative for vision changes.  ENT: Negative for hoarseness, difficulty swallowing , nasal congestion. CV: Negative for chest pain, angina, palpitations, dyspnea on exertion, peripheral edema.  Respiratory: Negative for dyspnea at rest, dyspnea on exertion, cough, sputum, wheezing.  GI: See history of present illness. GU:  Negative for dysuria, hematuria, urinary incontinence, urinary frequency, nocturnal urination.  MS: Negative for joint pain, low back pain.  Derm: Negative for rash or itching.  Neuro: Negative for weakness, abnormal sensation, seizure, frequent headaches, memory loss, confusion.  Psych: Negative for anxiety, depression, suicidal ideation, hallucinations.  Endo: Negative for unusual weight change.  Heme: Negative for bruising or bleeding. Allergy: Negative for rash or hives.    Physical Examination:  BP (!) 135/97   Pulse 84   Temp 97.8 F (36.6 C) (Oral)   Ht _0  (1.6 m)   Wt 205 lb (93 kg)   BMI 36.31 kg/m    General: Well-nourished, well-developed in no acute distress.  Head: Normocephalic, atraumatic.   Eyes: Conjunctiva pink, no icterus. Mouth: Oropharyngeal mucosa moist and pink , no lesions erythema or exudate. Neck: Supple without thyromegaly, masses, or lymphadenopathy.  Lungs: Clear to auscultation bilaterally.  Heart: Regular rate and rhythm, no murmurs rubs or gallops.  Abdomen: Bowel sounds are normal, nontender, nondistended, no hepatosplenomegaly or masses, no abdominal bruits or    hernia , no rebound or guarding.   Rectal: Deferred Extremities: No lower extremity edema. No clubbing or deformities.  Neuro: Alert and oriented x 4 ,  grossly normal neurologically.  Skin: Warm and dry, no rash or jaundice.   Psych: Alert and cooperative, normal mood and affect.  Labs: Lab Results  Component Value Date   CREATININE 0.79 01/24/2017   BUN 12 01/24/2017   NA 142 01/24/2017   K 4.0 01/24/2017   CL 101 01/24/2017   CO2 23 01/24/2017   Lab Results  Component Value Date   ALT 13 01/24/2017   AST 13 01/24/2017   ALKPHOS 93 01/24/2017   BILITOT 0.4 01/24/2017   Lab Results  Component Value Date   WBC 6.0 01/24/2017   HGB 13.3 12/29/2013   HCT 37.6 01/24/2017   MCV 92 01/24/2017   PLT 319 01/24/2017     Imaging Studies: No results found.

## 2017-01-28 NOTE — Progress Notes (Signed)
CC'D TO PCP °

## 2017-01-28 NOTE — Patient Instructions (Signed)
PA info submitted via [facility] website for TCS/+/-EGD. Approved. PA# [id number].

## 2017-01-28 NOTE — Patient Instructions (Signed)
1. Colonoscopy and possible upper endoscopy (if needed) in near future. See separate instructions.

## 2017-01-28 NOTE — Assessment & Plan Note (Signed)
51 year old female presenting for further evaluation of Hemoccult-positive stool on digital rectal exam by her gynecologist last week. Patient reports intermittent rectal bleeding with small blood clots at times. Complains of abdominal cramping associated with postprandial urgency, loose stools. 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 Family history quite significant for half sister succumbed to colon cancer in her 84s, father suspected to have colon cancer in his mid 78s.   At this time would offer colonoscopy, deep sedation in the OR. We discussed that if her colonoscopy was unremarkable, would consider possibility of upper endoscopy for heme positive stools given history of GERD/globus sensation/throat clearing.  I have discussed the risks, alternatives, benefits with regards to but not limited to the risk of reaction to medication, bleeding, infection, perforation and the patient is agreeable to proceed. Written consent to be obtained.

## 2017-01-30 ENCOUNTER — Encounter: Payer: Self-pay | Admitting: Advanced Practice Midwife

## 2017-01-30 NOTE — Patient Instructions (Signed)
Kristie Graham  01/30/2017     @PREFPERIOPPHARMACY @   Your procedure is scheduled on 02/05/2017.  Report to [facility] at 9:00 A.M.  Call this number if you have problems the morning of surgery:  [phone]   Remember:  Do not eat food or drink liquids after midnight.  Take these medicines the morning of surgery with A SIP OF WATER Xanax, Cozaar   Do not wear jewelry, make-up or nail polish.  Do not wear lotions, powders, or perfumes, or deoderant.  Do not shave 48 hours prior to surgery.  Men may shave face and neck.  Do not bring valuables to the hospital.  [facility] is not responsible for any belongings or valuables.  Contacts, dentures or bridgework may not be worn into surgery.  Leave your suitcase in the car.  After surgery it may be brought to your room.  For patients admitted to the hospital, discharge time will be determined by your treatment team.  Patients discharged the day of surgery will not be allowed to drive home.    Please read over the following fact sheets that you were given. Surgical Site Infection Prevention and Anesthesia Post-op Instructions     PATIENT INSTRUCTIONS POST-ANESTHESIA  IMMEDIATELY FOLLOWING SURGERY:  Do not drive or operate machinery for the first twenty four hours after surgery.  Do not make any important decisions for twenty four hours after surgery or while taking narcotic pain medications or sedatives.  If you develop intractable nausea and vomiting or a severe headache please notify your doctor immediately.  FOLLOW-UP:  Please make an appointment with your surgeon as instructed. You do not need to follow up with anesthesia unless specifically instructed to do so.  WOUND CARE INSTRUCTIONS (if applicable):  Keep a dry clean dressing on the anesthesia/puncture wound site if there is drainage.  Once the wound has quit draining you may leave it open to air.  Generally you should leave the bandage intact for twenty four hours unless  there is drainage.  If the epidural site drains for more than 36-48 hours please call the anesthesia department.  QUESTIONS?:  Please feel free to call your physician or the hospital operator if you have any questions, and they will be happy to assist you.      Esophagogastroduodenoscopy Esophagogastroduodenoscopy (EGD) is a procedure to examine the lining of the esophagus, stomach, and first part of the small intestine (duodenum). This procedure is done to check for problems such as inflammation, bleeding, ulcers, or growths. During this procedure, a long, flexible, lighted tube with a camera attached (endoscope) is inserted down the throat. Tell a health care provider about:  Any allergies you have.  All medicines you are taking, including vitamins, herbs, eye drops, creams, and over-the-counter medicines.  Any problems you or family members have had with anesthetic medicines.  Any blood disorders you have.  Any surgeries you have had.  Any medical conditions you have.  Whether you are pregnant or may be pregnant. What are the risks? Generally, this is a safe procedure. However, problems may occur, including:  Infection.  Bleeding.  A tear (perforation) in the esophagus, stomach, or duodenum.  Trouble breathing.  Excessive sweating.  Spasms of the larynx.  A slowed heartbeat.  Low blood pressure. What happens before the procedure?  Follow instructions from your health care provider about eating or drinking restrictions.  Ask your health care provider about:  Changing or stopping your regular medicines. This is especially important  if you are taking diabetes medicines or blood thinners.  Taking medicines such as aspirin and ibuprofen. These medicines can thin your blood. Do not take these medicines before your procedure if your health care provider instructs you not to.  Plan to have someone take you home after the procedure.  If you wear dentures, be ready to  remove them before the procedure. What happens during the procedure?  To reduce your risk of infection, your health care team will wash or sanitize their hands.  An IV tube will be put in a vein in your hand or arm. You will get medicines and fluids through this tube.  You will be given one or more of the following:  A medicine to help you relax (sedative).  A medicine to numb the area (local anesthetic). This medicine may be sprayed into your throat. It will make you feel more comfortable and keep you from gagging or coughing during the procedure.  A medicine for pain.  A mouth guard may be placed in your mouth to protect your teeth and to keep you from biting on the endoscope.  You will be asked to lie on your left side.  The endoscope will be lowered down your throat into your esophagus, stomach, and duodenum.  Air will be put into the endoscope. This will help your health care provider see better.  The lining of your esophagus, stomach, and duodenum will be examined.  Your health care provider may:  Take a tissue sample so it can be looked at in a lab (biopsy).  Remove growths.  Remove objects (foreign bodies) that are stuck.  Treat any bleeding with medicines or other devices that stop tissue from bleeding.  Widen (dilate) or stretch narrowed areas of your esophagus and stomach.  The endoscope will be taken out. The procedure may vary among health care providers and hospitals. What happens after the procedure?  Your blood pressure, heart rate, breathing rate, and blood oxygen level will be monitored often until the medicines you were given have worn off.  Do not eat or drink anything until the numbing medicine has worn off and your gag reflex has returned. This information is not intended to replace advice given to you by your health care provider. Make sure you discuss any questions you have with your health care provider. Document Released: 02/15/2005 Document  Revised: 03/22/2016 Document Reviewed: 09/08/2015 Elsevier Interactive Patient Education  2017 [place]. Colonoscopy, Adult A colonoscopy is an exam to look at the entire large intestine. During the exam, a lubricated, bendable tube is inserted into the anus and then passed into the rectum, colon, and other parts of the large intestine. A colonoscopy is often done as a part of normal colorectal screening or in response to certain symptoms, such as anemia, persistent diarrhea, abdominal pain, and blood in the stool. The exam can help screen for and diagnose medical problems, including:  Tumors.  Polyps.  Inflammation.  Areas of bleeding. Tell a health care provider about:  Any allergies you have.  All medicines you are taking, including vitamins, herbs, eye drops, creams, and over-the-counter medicines.  Any problems you or family members have had with anesthetic medicines.  Any blood disorders you have.  Any surgeries you have had.  Any medical conditions you have.  Any problems you have had passing stool. What are the risks? Generally, this is a safe procedure. However, problems may occur, including:  Bleeding.  A tear in the intestine.  A reaction  to medicines given during the exam.  Infection (rare). What happens before the procedure? Eating and drinking restrictions  Follow instructions from your health care provider about eating and drinking, which may include:  A few days before the procedure - follow a low-fiber diet. Avoid nuts, seeds, dried fruit, raw fruits, and vegetables.  1-3 days before the procedure - follow a clear liquid diet. Drink only clear liquids, such as clear broth or bouillon, black coffee or tea, clear juice, clear soft drinks or sports drinks, gelatin dessert, and popsicles. Avoid any liquids that contain red or purple dye.  On the day of the procedure - do not eat or drink anything during the 2 hours before the procedure, or within the  time period that your health care provider recommends. Bowel prep  If you were prescribed an oral bowel prep to clean out your colon:  Take it as told by your health care provider. Starting the day before your procedure, you will need to drink a large amount of medicated liquid. The liquid will cause you to have multiple loose stools until your stool is almost clear or light green.  If your skin or anus gets irritated from diarrhea, you may use these to relieve the irritation:  Medicated wipes, such as adult wet wipes with aloe and vitamin E.  A skin soothing-product like petroleum jelly.  If you vomit while drinking the bowel prep, take a break for up to 60 minutes and then begin the bowel prep again. If vomiting continues and you cannot take the bowel prep without vomiting, call your health care provider. General instructions   Ask your health care provider about changing or stopping your regular medicines. This is especially important if you are taking diabetes medicines or blood thinners.  Plan to have someone take you home from the hospital or clinic. What happens during the procedure?  An IV tube may be inserted into one of your veins.  You will be given medicine to help you relax (sedative).  To reduce your risk of infection:  Your health care team will wash or sanitize their hands.  Your anal area will be washed with soap.  You will be asked to lie on your side with your knees bent.  Your health care provider will lubricate a long, thin, flexible tube. The tube will have a camera and a light on the end.  The tube will be inserted into your anus.  The tube will be gently eased through your rectum and colon.  Air will be delivered into your colon to keep it open. You may feel some pressure or cramping.  The camera will be used to take images during the procedure.  A small tissue sample may be removed from your body to be examined under a microscope (biopsy). If any  potential problems are found, the tissue will be sent to a lab for testing.  If small polyps are found, your health care provider may remove them and have them checked for cancer cells.  The tube that was inserted into your anus will be slowly removed. The procedure may vary among health care providers and hospitals. What happens after the procedure?  Your blood pressure, heart rate, breathing rate, and blood oxygen level will be monitored until the medicines you were given have worn off.  Do not drive for 24 hours after the exam.  You may have a small amount of blood in your stool.  You may pass gas and have mild abdominal  cramping or bloating due to the air that was used to inflate your colon during the exam.  It is up to you to get the results of your procedure. Ask your health care provider, or the department performing the procedure, when your results will be ready. This information is not intended to replace advice given to you by your health care provider. Make sure you discuss any questions you have with your health care provider. Document Released: 10/12/2000 Document Revised: 08/15/2016 Document Reviewed: 12/27/2015 Elsevier Interactive Patient Education  2017 [employer].

## 2017-02-01 ENCOUNTER — Encounter (HOSPITAL_COMMUNITY): Payer: Self-pay

## 2017-02-01 ENCOUNTER — Encounter (HOSPITAL_COMMUNITY)
Admission: RE | Admit: 2017-02-01 | Discharge: 2017-02-01 | Disposition: A | Discharge status: Home / Self Care | Payer: Commercial Managed Care - HMO | Source: Ambulatory Visit | Attending: Gastroenterology | Admitting: Gastroenterology

## 2017-02-01 DIAGNOSIS — Z01810 Encounter for preprocedural cardiovascular examination: Secondary | ICD-10-CM | POA: Insufficient documentation

## 2017-02-01 DIAGNOSIS — R9431 Abnormal electrocardiogram [ECG] [EKG]: Secondary | ICD-10-CM | POA: Diagnosis not present

## 2017-02-01 HISTORY — DX: Gastro-esophageal reflux disease without esophagitis: K21.9

## 2017-02-05 ENCOUNTER — Ambulatory Visit (HOSPITAL_COMMUNITY): Payer: Commercial Managed Care - HMO | Admitting: Anesthesiology

## 2017-02-05 ENCOUNTER — Ambulatory Visit (HOSPITAL_COMMUNITY)
Admission: RE | Admit: 2017-02-05 | Discharge: 2017-02-05 | Disposition: A | Discharge status: Home / Self Care | Payer: Commercial Managed Care - HMO | Source: Ambulatory Visit | Attending: Gastroenterology | Admitting: Gastroenterology

## 2017-02-05 ENCOUNTER — Telehealth: Payer: Self-pay | Admitting: Gastroenterology

## 2017-02-05 ENCOUNTER — Encounter (HOSPITAL_COMMUNITY): Payer: Self-pay | Admitting: *Deleted

## 2017-02-05 ENCOUNTER — Other Ambulatory Visit: Payer: Self-pay

## 2017-02-05 ENCOUNTER — Encounter (HOSPITAL_COMMUNITY)
Admission: RE | Disposition: A | Discharge status: Home / Self Care | Payer: Self-pay | Source: Ambulatory Visit | Attending: Gastroenterology

## 2017-02-05 DIAGNOSIS — K625 Hemorrhage of anus and rectum: Secondary | ICD-10-CM

## 2017-02-05 DIAGNOSIS — K644 Residual hemorrhoidal skin tags: Secondary | ICD-10-CM | POA: Insufficient documentation

## 2017-02-05 DIAGNOSIS — Z9049 Acquired absence of other specified parts of digestive tract: Secondary | ICD-10-CM | POA: Insufficient documentation

## 2017-02-05 DIAGNOSIS — K2970 Gastritis, unspecified, without bleeding: Secondary | ICD-10-CM | POA: Diagnosis not present

## 2017-02-05 DIAGNOSIS — Z882 Allergy status to sulfonamides status: Secondary | ICD-10-CM | POA: Insufficient documentation

## 2017-02-05 DIAGNOSIS — I129 Hypertensive chronic kidney disease with stage 1 through stage 4 chronic kidney disease, or unspecified chronic kidney disease: Secondary | ICD-10-CM | POA: Insufficient documentation

## 2017-02-05 DIAGNOSIS — Z888 Allergy status to other drugs, medicaments and biological substances status: Secondary | ICD-10-CM | POA: Diagnosis not present

## 2017-02-05 DIAGNOSIS — K30 Functional dyspepsia: Secondary | ICD-10-CM

## 2017-02-05 DIAGNOSIS — N189 Chronic kidney disease, unspecified: Secondary | ICD-10-CM | POA: Insufficient documentation

## 2017-02-05 DIAGNOSIS — Q438 Other specified congenital malformations of intestine: Secondary | ICD-10-CM | POA: Diagnosis not present

## 2017-02-05 DIAGNOSIS — K2950 Unspecified chronic gastritis without bleeding: Secondary | ICD-10-CM | POA: Diagnosis not present

## 2017-02-05 DIAGNOSIS — R1013 Epigastric pain: Secondary | ICD-10-CM | POA: Insufficient documentation

## 2017-02-05 DIAGNOSIS — Z800 Family history of malignant neoplasm of digestive organs: Secondary | ICD-10-CM

## 2017-02-05 DIAGNOSIS — R195 Other fecal abnormalities: Secondary | ICD-10-CM

## 2017-02-05 DIAGNOSIS — F419 Anxiety disorder, unspecified: Secondary | ICD-10-CM | POA: Insufficient documentation

## 2017-02-05 DIAGNOSIS — E559 Vitamin D deficiency, unspecified: Secondary | ICD-10-CM | POA: Insufficient documentation

## 2017-02-05 DIAGNOSIS — Z79899 Other long term (current) drug therapy: Secondary | ICD-10-CM | POA: Insufficient documentation

## 2017-02-05 DIAGNOSIS — K921 Melena: Secondary | ICD-10-CM | POA: Diagnosis present

## 2017-02-05 DIAGNOSIS — K648 Other hemorrhoids: Secondary | ICD-10-CM | POA: Diagnosis not present

## 2017-02-05 DIAGNOSIS — K2980 Duodenitis without bleeding: Secondary | ICD-10-CM | POA: Insufficient documentation

## 2017-02-05 DIAGNOSIS — K219 Gastro-esophageal reflux disease without esophagitis: Secondary | ICD-10-CM | POA: Diagnosis not present

## 2017-02-05 DIAGNOSIS — G8929 Other chronic pain: Secondary | ICD-10-CM | POA: Diagnosis not present

## 2017-02-05 DIAGNOSIS — K649 Unspecified hemorrhoids: Secondary | ICD-10-CM

## 2017-02-05 DIAGNOSIS — R1033 Periumbilical pain: Secondary | ICD-10-CM | POA: Diagnosis not present

## 2017-02-05 HISTORY — PX: ESOPHAGOGASTRODUODENOSCOPY (EGD) WITH PROPOFOL: SHX5813

## 2017-02-05 HISTORY — PX: COLONOSCOPY WITH PROPOFOL: SHX5780

## 2017-02-05 HISTORY — PX: BIOPSY: SHX5522

## 2017-02-05 SURGERY — COLONOSCOPY WITH PROPOFOL
Anesthesia: Monitor Anesthesia Care

## 2017-02-05 MED ORDER — PROPOFOL 500 MG/50ML IV EMUL
INTRAVENOUS | Status: DC | PRN
  Administered 2017-02-05: 125 ug/kg/min via INTRAVENOUS
  Administered 2017-02-05 (×2): via INTRAVENOUS

## 2017-02-05 MED ORDER — MIDAZOLAM HCL 2 MG/2ML IJ SOLN
INTRAMUSCULAR | Status: AC
  Filled 2017-02-05: qty 2

## 2017-02-05 MED ORDER — LACTATED RINGERS IV SOLN
INTRAVENOUS | Status: DC
  Administered 2017-02-05: 1000 mL via INTRAVENOUS

## 2017-02-05 MED ORDER — FENTANYL CITRATE (PF) 100 MCG/2ML IJ SOLN
25.0000 ug | INTRAMUSCULAR | Status: AC
  Administered 2017-02-05 (×2): 25 ug via INTRAVENOUS

## 2017-02-05 MED ORDER — CHLORHEXIDINE GLUCONATE CLOTH 2 % EX PADS: 6.0000 | MEDICATED_PAD | Freq: Once | CUTANEOUS | Status: DC

## 2017-02-05 MED ORDER — ONDANSETRON HCL 4 MG/2ML IJ SOLN
INTRAMUSCULAR | Status: AC
  Filled 2017-02-05: qty 2

## 2017-02-05 MED ORDER — PANTOPRAZOLE SODIUM 40 MG PO TBEC: DELAYED_RELEASE_TABLET | ORAL | 11 refills | Status: DC

## 2017-02-05 MED ORDER — ONDANSETRON HCL 4 MG/2ML IJ SOLN
4.0000 mg | Freq: Once | INTRAMUSCULAR | Status: AC
  Administered 2017-02-05: 4 mg via INTRAVENOUS

## 2017-02-05 MED ORDER — MIDAZOLAM HCL 2 MG/2ML IJ SOLN
1.0000 mg | INTRAMUSCULAR | Status: AC
  Administered 2017-02-05 (×2): 2 mg via INTRAVENOUS
  Filled 2017-02-05: qty 2

## 2017-02-05 MED ORDER — LIDOCAINE VISCOUS 2 % MT SOLN
OROMUCOSAL | Status: AC
  Filled 2017-02-05: qty 15

## 2017-02-05 MED ORDER — LIDOCAINE VISCOUS 2 % MT SOLN
5.0000 mL | Freq: Two times a day (BID) | OROMUCOSAL | Status: DC
  Administered 2017-02-05: 5 mL via OROMUCOSAL

## 2017-02-05 MED ORDER — PROPOFOL 10 MG/ML IV BOLUS
INTRAVENOUS | Status: AC
  Filled 2017-02-05: qty 40

## 2017-02-05 MED ORDER — RANITIDINE HCL 150 MG PO TABS: ORAL_TABLET | ORAL | 11 refills | Status: AC

## 2017-02-05 MED ORDER — FENTANYL CITRATE (PF) 100 MCG/2ML IJ SOLN
INTRAMUSCULAR | Status: AC
  Filled 2017-02-05: qty 2

## 2017-02-05 NOTE — Anesthesia Postprocedure Evaluation (Signed)
Anesthesia Post Note  Patient: Kristie Graham  Procedure(s) Performed: Procedure(s) (LRB): COLONOSCOPY WITH PROPOFOL (N/A) ESOPHAGOGASTRODUODENOSCOPY (EGD) WITH PROPOFOL (N/A) BIOPSY  Patient location during evaluation: PACU Anesthesia Type: MAC Level of consciousness: awake, oriented and patient cooperative Pain management: pain level controlled Vital Signs Assessment: post-procedure vital signs reviewed and stable Respiratory status: spontaneous breathing, nonlabored ventilation and respiratory function stable Cardiovascular status: blood pressure returned to baseline Postop Assessment: no signs of nausea or vomiting Anesthetic complications: no     Last Vitals:  Vitals:   02/05/17 1035 02/05/17 1040  BP: 128/83 (!) 142/94  Pulse:    Resp: 15 16  Temp:      Last Pain:  Vitals:   02/05/17 0855  TempSrc: Oral                 [name]

## 2017-02-05 NOTE — H&P (Signed)
Primary Care Physician:  Glo Herring, MD Primary Gastroenterologist:  Dr. Oneida Alar  Pre-Procedure History & Physical: HPI:  Kristie Graham is a 51 y.o. female here for BRBPR/dyspepsia.  Past Medical History:  Diagnosis Date  . Anxiety   . Back pain    chronic  . Chronic back pain   . Chronic kidney disease    kidney stones  . Complication of anesthesia   . Enlarged thyroid 01/06/2016  . GERD (gastroesophageal reflux disease)   . Hypertension   . PONV (postoperative nausea and vomiting)   . Tension type headache 01/06/2016  . Vaginal Pap smear, abnormal   . Vitamin D deficiency 01/10/2016    Past Surgical History:  Procedure Laterality Date  . BIOPSY  11/26/2011   Procedure: BIOPSY;  Surgeon: Dorothyann Peng, MD;  Location: [facility];  Service: Endoscopy;;  Rectal polyp biopsy  . CHOLECYSTECTOMY  2009  . COLONOSCOPY  10/2011   Dr. Oneida Alar: MAC. Multiple polyps in the rectum, tubular adenoma. Diverticulosis sigmoid colon, internal hemorrhoids.  . ENDOMETRIAL ABLATION  2011   [facility]  . ENDOMETRIAL BIOPSY  2010  . TUBAL LIGATION  1995    Prior to Admission medications   Medication Sig Start Date End Date Taking? Authorizing Provider  ALPRAZolam Duanne Moron) 1 MG tablet Take 1 mg by mouth 2 (two) times daily as needed (for anxiety.). Anxiety 11/05/11  Yes Historical Provider, MD  Cholecalciferol (VITAMIN D3 PO) Take 1 tablet by mouth daily.   Yes Historical Provider, MD  IBU 800 MG tablet Take 800 mg by mouth 3 (three) times daily as needed for pain. 01/11/17  Yes Historical Provider, MD  losartan (COZAAR) 25 MG tablet Take 25 mg by mouth daily.  12/08/14  Yes Historical Provider, MD  Na Sulfate-K Sulfate-Mg Sulf (SUPREP BOWEL PREP KIT) 17.5-3.13-1.6 GM/180ML SOLN Take 1 kit by mouth as directed. 01/28/17  Yes Danie Binder, MD  ranitidine (ZANTAC) 150 MG tablet Take 150 mg by mouth once.   Yes Historical Provider, MD    Allergies as of 01/28/2017 - Review Complete 01/28/2017  Allergen  Reaction Noted  . Prednisone Other (See Comments) 01/04/2015  . Sulfa antibiotics Hives and Itching 12/29/2013    Family History  Problem Relation Age of Onset  . Prostate cancer Brother     age 62s  . Cancer Father     ?colon, deceased age 80  . Colon cancer Sister     1/2 sister, dx in 70s  . Heart disease Mother   . Heart disease Maternal Grandfather   . Anesthesia problems Neg Hx   . Hypotension Neg Hx   . Malignant hyperthermia Neg Hx   . Pseudochol deficiency Neg Hx     Social History   Social History  . Marital status: Divorced    Spouse name: N/A  . Number of children: N/A  . Years of education: N/A   Occupational History  . Not on file.   Social History Main Topics  . Smoking status: Never Smoker  . Smokeless tobacco: Never Used  . Alcohol use Yes     Comment: occassionally  . Drug use: No  . Sexual activity: Yes    Birth control/ protection: Surgical     Comment:  tubal and ablation   Other Topics Concern  . Not on file   Social History Narrative  . No narrative on file    Review of Systems: See HPI, otherwise negative ROS   Physical Exam: BP 128/85  Pulse 82   Temp 97.9 F (36.6 C) (Oral)   Resp 15   Ht _0  (1.6 m)   Wt 208 lb (94.3 kg)   SpO2 100%   BMI 36.85 kg/m  General:   Alert,  pleasant and cooperative in NAD Head:  Normocephalic and atraumatic. Neck:  Supple; Lungs:  Clear throughout to auscultation.    Heart:  Regular rate and rhythm. Abdomen:  Soft, nontender and nondistended. Normal bowel sounds, without guarding, and without rebound.   Neurologic:  Alert and  oriented x4;  grossly normal neurologically.  Impression/Plan:    BRBPR/dyspepsia  PLAN: EGD/TCS TODAY.DISCUSSED PROCEDURE, BENEFITS, & RISKS: < 1% chance of medication reaction, bleeding, perforation, or rupture of spleen/liver.

## 2017-02-05 NOTE — Telephone Encounter (Signed)
SEE SURGERY  DR. Arnoldo Morale, DX: HEMORRHOIDS, EVALUATE FOR HEMORRHOIDECTOMY.Marland Kitchen

## 2017-02-05 NOTE — Op Note (Signed)
[facility] Patient Name: Kristie Graham Procedure Date: 02/05/2017 11:11 AM MRN: [id number] Date of Birth: Aug 29, 1966 Attending MD: Barney Drain , MD CSN: [id number] Age: 51 Admit Type: Outpatient Procedure:                Upper GI endoscopy with cold forceps biopsy Indications:              Functional Dyspepsia, Esophageal reflux symptoms                            that persist despite appropriate therapy(ZANTAC                            ONCE DAILY) Providers:                Barney Drain, MD, Janeece Riggers, RN, Aram Candela Referring MD:             Redmond School, MD Medicines:                Propofol per Anesthesia Complications:            No immediate complications. Estimated Blood Loss:     Estimated blood loss was minimal. Procedure:                Pre-Anesthesia Assessment:                           - Prior to the procedure, a History and Physical                            was performed, and patient medications and                            allergies were reviewed. The patient's tolerance of                            previous anesthesia was also reviewed. The risks                            and benefits of the procedure and the sedation                            options and risks were discussed with the patient.                            All questions were answered, and informed consent                            was obtained. Prior Anticoagulants: The patient has                            taken ibuprofen, last dose was 1 day prior to                            procedure. ASA Grade Assessment: II - A patient  with mild systemic disease. After reviewing the                            risks and benefits, the patient was deemed in                            satisfactory condition to undergo the procedure.                            After obtaining informed consent, the endoscope was                            passed under direct vision.  Throughout the                            procedure, the patient's blood pressure, pulse, and                            oxygen saturations were monitored continuously. The                            [id number] ([id number]) scope was introduced through the                            mouth, and advanced to the second part of duodenum.                            The upper GI endoscopy was accomplished without                            difficulty. The patient tolerated the procedure                            well. Scope In: 11:16:58 AM Scope Out: 11:25:32 AM Total Procedure Duration: 0 hours 8 minutes 34 seconds  Findings:      The examined esophagus was normal.      Patchy moderate inflammation characterized by congestion (edema) and       erythema was found in the gastric body and in the gastric antrum.       Biopsies were taken with a cold forceps for Helicobacter pylori testing.      The examined duodenum was normal. Biopsies for histology were taken with       a cold forceps for evaluation of celiac disease. 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                           -  Gastritis. Biopsied. Moderate Sedation:      Per Anesthesia Care Recommendation:           - Await pathology results.                           - High fiber diet and low fat diet. LOSE WEIGHT.                            AVOID REFLUX TRIGGERS.                           - Continue present medications.                           - ADD Protonix (pantoprazole) 40 mg PO daily.                            ZANTAC IF NEEDED. CHEW ONE TUMS WITH MEALS UP TO                            THREE TIMES A DAY.                           - Patient has a contact number available for                             emergencies. The signs and symptoms of potential                            delayed complications were discussed with the                            patient. Return to normal activities tomorrow.                            Written discharge instructions were provided to the                            patient. Procedure Code(s):        --- Professional ---                           [phone], Esophagogastroduodenoscopy, flexible,                            transoral; with biopsy, single or multiple Diagnosis Code(s):        --- Professional ---                           K29.70, Gastritis, unspecified, without bleeding                           K30, Functional dyspepsia                           K21.9, Gastro-esophageal reflux disease without  esophagitis CPT copyright 2016 American Medical Association. All rights reserved. The codes documented in this report are preliminary and upon coder review may  be revised to meet current compliance requirements. Barney Drain, MD Barney Drain, MD 02/05/2017 11:41:14 AM This report has been signed electronically. Number of Addenda: 0

## 2017-02-05 NOTE — Discharge Instructions (Signed)
Your abdominal pain and bloating may be due to gastritis due to ibuprofen and reflux.  YOUR UPPER ENDOSCOPY SHOWED A small HIATAL HERNIA. You have LARGE internal hemorrhoids, WHICH are THE CAUSE FOR YOUR RECTAL BLEEDING. YOU DID NOT HAVE ANY POLYPS. I biopsied your stomach AND SMALL BOWEL. THE LAST PART OF YOUR SMALL BOWEL IS NORMAL.   DRINK WATER TO KEEP YOUR URINE LIGHT YELLOW.  CONTINUE YOUR WEIGHT LOSS EFFORTS.  LOSE 20 POUNDS. WHILE I DO NOT WANT TO ALARM YOU, YOUR BODY MASS INDEX IS OVER 30 WHICH MEANS YOU ARE OBESE. OBESITY TURNS ON CANCER GENES. OBESITY IS ASSOCIATED WITH AN INCREASE RISK FOR ALL CANCERS, INCLUDING ESOPHAGEAL AND COLON CANCER.  LOSING WEIGHT WILL HELP YOUR REFLUX BE BETTER CONTROLLED AND DECREASE YOUR RISK FOR COLON CANCER.  STRICTLY FOLLOW A HIGH FIBER/LOW FAT DIET. AVOID ITEMS THAT CAUSE BLOATING. MEATS SHOULD BE BAKED, BROILED, OR BOILED. NO FRIED FOODS. SEE INFO BELOW.  ADD PROTONIX. TAKE 30 MINUTES PRIOR TO BREAKFAST.  USE PEPCID OR ZANTAC IF NEEDED TO CONTROL HEARTBURN/REFLUX.  USE PREPARATION H FOUR TIMES A DAY IF NEEDED TO RELIEVE RECTAL PAIN/PRESSURE/BLEEDING.  SEE SURGERY TO FIX YOUR HEMORRHOIDS.  YOUR BIOPSY RESULTS WILL BE AVAILABLE IN MY CHART AFTER APR 15 AND MY OFFICE WILL CONTACT YOU IN 10-14 DAYS WITH YOUR RESULTS.   FOLLOW UP IN 4 MOS.  NEXT COLONOSCOPY IN 5-10 YEARS.   ENDOSCOPY Care After Read the instructions outlined below and refer to this sheet in the next week. These discharge instructions provide you with general information on caring for yourself after you leave the hospital. While your treatment has been planned according to the most current medical practices available, unavoidable complications occasionally occur. If you have any problems or questions after discharge, call DR. Qaadir Kent, [phone].  ACTIVITY  You may resume your regular activity, but move at a slower pace for the next 24 hours.   Take frequent rest periods for  the next 24 hours.   Walking will help get rid of the air and reduce the bloated feeling in your belly (abdomen).   No driving for 24 hours (because of the medicine (anesthesia) used during the test).   You may shower.   Do not sign any important legal documents or operate any machinery for 24 hours (because of the anesthesia used during the test).    NUTRITION  Drink plenty of fluids.   You may resume your normal diet as instructed by your doctor.   Begin with a light meal and progress to your normal diet. Heavy or fried foods are harder to digest and may make you feel sick to your stomach (nauseated).   Avoid alcoholic beverages for 24 hours or as instructed.    MEDICATIONS  You may resume your normal medications.   WHAT YOU CAN EXPECT TODAY  Some feelings of bloating in the abdomen.   Passage of more gas than usual.   Spotting of blood in your stool or on the toilet paper  .  IF YOU HAD POLYPS REMOVED DURING THE ENDOSCOPY:  Eat a soft diet IF YOU HAVE NAUSEA, BLOATING, ABDOMINAL PAIN, OR VOMITING.    FINDING OUT THE RESULTS OF YOUR TEST Not all test results are available during your visit. DR. Oneida Alar WILL CALL YOU WITHIN 14 DAYS OF YOUR PROCEDUE WITH YOUR RESULTS. Do not assume everything is normal if you have not heard from DR. Sophi Calligan,, CALL HER OFFICE AT [phone].  SEEK IMMEDIATE MEDICAL ATTENTION AND CALL THE OFFICE:  [phone] IF:  You have more than a spotting of blood in your stool.   Your belly is swollen (abdominal distention).   You are nauseated or vomiting.   You have a temperature over 101F.   You have abdominal pain or discomfort that is severe or gets worse throughout the day.  Gastritis  Gastritis is an inflammation (the body's way of reacting to injury and/or infection) of the stomach.It is often caused by bacterial (germ) infections. It can also be caused BY ASPIRIN, BC/GOODY POWDER'S, (IBUPROFEN) MOTRIN, OR ALEVE (NAPROXEN),  chemicals (including alcohol), SPICY FOODS, and medications. This illness may be associated with generalized malaise (feeling tired, not well), UPPER ABDOMINAL STOMACH cramps, and fever. One common bacterial cause of gastritis is an organism known as H. Pylori. This can be treated with antibiotics.     Hiatal Hernia A hiatal hernia occurs when a part of the stomach slides above the diaphragm. The diaphragm is the thin muscle separating the belly (abdomen) from the chest. A hiatal hernia can be something you are born with or develop over time. Hiatal hernias may allow stomach acid to flow back into your esophagus, the tube which carries food from your mouth to your stomach. If this acid causes problems it is called GERD (gastro-esophageal reflux disease).   SYMPTOMS Common symptoms of GERD are heartburn (burning in your chest). This is worse when lying down or bending over. It may also cause belching and indigestion. Some of the things which make GERD worse are:  Increased weight pushes on stomach making acid rise more easily.   Smoking markedly increases acid production.   Alcohol decreases lower esophageal sphincter pressure (valve between stomach and esophagus), allowing acid from stomach into esophagus.   Late evening meals and going to bed with a full stomach increases pressure.     Lifestyle and home remedies to control heartburn  You may eliminate or reduce the frequency of heartburn by making the following lifestyle changes:   Control your weight. Being overweight is a major risk factor for heartburn and GERD. Excess pounds put pressure on your abdomen, pushing up your stomach and causing acid to back up into your esophagus.    Eat smaller meals. 4 TO 6 MEALS A DAY. This reduces pressure on the lower esophageal sphincter, helping to prevent the valve from opening and acid from washing back into your esophagus.    Loosen your belt. Clothes that fit tightly around your waist put  pressure on your abdomen and the lower esophageal sphincter.    Eliminate heartburn triggers. Everyone has specific triggers. Common triggers such as fatty or fried foods, spicy food, tomato sauce, carbonated beverages, alcohol, chocolate, mint, garlic, onion, caffeine and nicotine may make heartburn worse.    Avoid stooping or bending. Tying your shoes is OK. Bending over for longer periods to weed your garden isn't, especially soon after eating.    Don't lie down after a meal. Wait at least three to four hours after eating before going to bed, and don't lie down right after eating.    Alternative medicine  Several home remedies exist for treating GERD, but they provide only temporary relief. They include drinking baking soda (sodium bicarbonate) added to water or drinking other fluids such as baking soda mixed with cream of tartar and water.   Although these liquids create temporary relief by neutralizing, washing away or buffering acids, eventually they aggravate the situation by adding gas and fluid to your stomach, increasing pressure and causing  more acid reflux. Further, adding more sodium to your diet may increase your blood pressure and add stress to your heart, and excessive bicarbonate ingestion can alter the acid-base balance in your body.  High-Fiber Diet A high-fiber diet changes your normal diet to include more whole grains, legumes, fruits, and vegetables. Changes in the diet involve replacing refined carbohydrates with unrefined foods. The calorie level of the diet is essentially unchanged. The Dietary Reference Intake (recommended amount) for adult males is 38 grams per day. For adult females, it is 25 grams per day. Pregnant and lactating women should consume 28 grams of fiber per day. Fiber is the intact part of a plant that is not broken down during digestion. Functional fiber is fiber that has been isolated from the plant to provide a beneficial effect in the  body. PURPOSE  Increase stool bulk.   Ease and regulate bowel movements.   Lower cholesterol.   REDUCE RISK OF COLON CANCER  INDICATIONS THAT YOU NEED MORE FIBER  Constipation and hemorrhoids.   Uncomplicated diverticulosis (intestine condition) and irritable bowel syndrome.   Weight management.   As a protective measure against hardening of the arteries (atherosclerosis), diabetes, and cancer.   GUIDELINES FOR INCREASING FIBER IN THE DIET  Start adding fiber to the diet slowly. A gradual increase of about 5 more grams (2 slices of whole-wheat bread, 2 servings of most fruits or vegetables, or 1 bowl of high-fiber cereal) per day is best. Too rapid an increase in fiber may result in constipation, flatulence, and bloating.   Drink enough water and fluids to keep your urine clear or pale yellow. Water, juice, or caffeine-free drinks are recommended. Not drinking enough fluid may cause constipation.   Eat a variety of high-fiber foods rather than one type of fiber.   Try to increase your intake of fiber through using high-fiber foods rather than fiber pills or supplements that contain small amounts of fiber.   The goal is to change the types of food eaten. Do not supplement your present diet with high-fiber foods, but replace foods in your present diet.  INCLUDE A VARIETY OF FIBER SOURCES  Replace refined and processed grains with whole grains, canned fruits with fresh fruits, and incorporate other fiber sources. White rice, white breads, and most bakery goods contain little or no fiber.   Brown whole-grain rice, buckwheat oats, and many fruits and vegetables are all good sources of fiber. These include: broccoli, Brussels sprouts, cabbage, cauliflower, beets, sweet potatoes, white potatoes (skin on), carrots, tomatoes, eggplant, squash, berries, fresh fruits, and dried fruits.   Cereals appear to be the richest source of fiber. Cereal fiber is found in whole grains and bran. Bran  is the fiber-rich outer coat of cereal grain, which is largely removed in refining. In whole-grain cereals, the bran remains. In breakfast cereals, the largest amount of fiber is found in those with "bran" in their names. The fiber content is sometimes indicated on the label.   You may need to include additional fruits and vegetables each day.   In baking, for 1 cup white flour, you may use the following substitutions:   1 cup whole-wheat flour minus 2 tablespoons.   1/2 cup white flour plus 1/2 cup whole-wheat flour.   Low-Fat Diet BREADS, CEREALS, PASTA, RICE, DRIED PEAS, AND BEANS These products are high in carbohydrates and most are low in fat. Therefore, they can be increased in the diet as substitutes for fatty foods. They too, however, contain calories  and should not be eaten in excess. Cereals can be eaten for snacks as well as for breakfast.  Include foods that contain fiber (fruits, vegetables, whole grains, and legumes). Research shows that fiber may lower blood cholesterol levels, especially the water-soluble fiber found in fruits, vegetables, oat products, and legumes. FRUITS AND VEGETABLES It is good to eat fruits and vegetables. Besides being sources of fiber, both are rich in vitamins and some minerals. They help you get the daily allowances of these nutrients. Fruits and vegetables can be used for snacks and desserts. MEATS Limit lean meat, chicken, [place], and fish to no more than 6 ounces per day. Beef, Pork, and Lamb Use lean cuts of beef, pork, and lamb. Lean cuts include:  Extra-lean ground beef.  Arm roast.  Sirloin tip.  Center-cut ham.  Round steak.  Loin chops.  Rump roast.  Tenderloin.  Trim all fat off the outside of meats before cooking. It is not necessary to severely decrease the intake of red meat, but lean choices should be made. Lean meat is rich in protein and contains a highly absorbable form of iron. Premenopausal women, in particular, should avoid  reducing lean red meat because this could increase the risk for low red blood cells (iron-deficiency anemia). The organ meats, such as liver, sweetbreads, kidneys, and brain are very rich in cholesterol. They should be limited. Chicken and [place] These are good sources of protein. The fat of poultry can be reduced by removing the skin and underlying fat layers before cooking. Chicken and [place] can be substituted for lean red meat in the diet. Poultry should not be fried or covered with high-fat sauces. Fish and Shellfish Fish is a good source of protein. Shellfish contain cholesterol, but they usually are low in saturated fatty acids. The preparation of fish is important. Like chicken and [place], they should not be fried or covered with high-fat sauces. EGGS Egg whites contain no fat or cholesterol. They can be eaten often. Try 1 to 2 egg whites instead of whole eggs in recipes or use egg substitutes that do not contain yolk. MILK AND DAIRY PRODUCTS Use skim or 1% milk instead of 2% or whole milk. Decrease whole milk, natural, and processed cheeses. Use nonfat or low-fat (2%) cottage cheese or low-fat cheeses made from vegetable oils. Choose nonfat or low-fat (1 to 2%) yogurt. Experiment with evaporated skim milk in recipes that call for heavy cream. Substitute low-fat yogurt or low-fat cottage cheese for sour cream in dips and salad dressings. Have at least 2 servings of low-fat dairy products, such as 2 glasses of skim (or 1%) milk each day to help get your daily calcium intake.  FATS AND OILS Reduce the total intake of fats, especially saturated fat. Butterfat, lard, and beef fats are high in saturated fat and cholesterol. These should be avoided as much as possible. Vegetable fats do not contain cholesterol, but certain vegetable fats, such as coconut oil, palm oil, and palm kernel oil are very high in saturated fats. These should be limited. These fats are often used in bakery goods, processed  foods, popcorn, oils, and nondairy creamers. Vegetable shortenings and some peanut butters contain hydrogenated oils, which are also saturated fats. Read the labels on these foods and check for saturated vegetable oils. Unsaturated vegetable oils and fats do not raise blood cholesterol. However, they should be limited because they are fats and are high in calories. Total fat should still be limited to 30% of your daily caloric  intake. Desirable liquid vegetable oils are corn oil, cottonseed oil, olive oil, canola oil, safflower oil, soybean oil, and sunflower oil. Peanut oil is not as good, but small amounts are acceptable. Buy a heart-healthy tub margarine that has no partially hydrogenated oils in the ingredients. Mayonnaise and salad dressings often are made from unsaturated fats, but they should also be limited because of their high calorie and fat content. Seeds, nuts, peanut butter, olives, and avocados are high in fat, but the fat is mainly the unsaturated type. These foods should be limited mainly to avoid excess calories and fat. OTHER EATING TIPS Snacks  Most sweets should be limited as snacks. They tend to be rich in calories and fats, and their caloric content outweighs their nutritional value. Some good choices in snacks are graham crackers, melba toast, soda crackers, bagels (no egg), English muffins, fruits, and vegetables. These snacks are preferable to snack crackers, [place] fries, and chips. Popcorn should be air-popped or cooked in small amounts of liquid vegetable oil. Desserts Eat fruit, low-fat yogurt, and fruit ices. AVOID pastries, cake, and cookies. Sherbet, angel food cake, gelatin dessert, frozen low-fat yogurt, or other frozen products that do not contain saturated fat (pure fruit juice bars, frozen ice pops) are also acceptable.  COOKING METHODS Choose those methods that use little or no fat. They include: Poaching.  Braising.  Steaming.  Grilling.  Baking.  Stir-frying.   Broiling.  Microwaving.  Foods can be cooked in a nonstick pan without added fat, or use a nonfat cooking spray in regular cookware. Limit fried foods and avoid frying in saturated fat. Add moisture to lean meats by using water, broth, cooking wines, and other nonfat or low-fat sauces along with the cooking methods mentioned above. Soups and stews should be chilled after cooking. The fat that forms on top after a few hours in the refrigerator should be skimmed off. When preparing meals, avoid using excess salt. Salt can contribute to raising blood pressure in some people. EATING AWAY FROM HOME Order entres, potatoes, and vegetables without sauces or butter. When meat exceeds the size of a deck of cards (3 to 4 ounces), the rest can be taken home for another meal. Choose vegetable or fruit salads and ask for low-calorie salad dressings to be served on the side. Use dressings sparingly. Limit high-fat toppings, such as bacon, crumbled eggs, cheese, sunflower seeds, and olives. Ask for heart-healthy tub margarine instead of butter.  Hemorrhoids Hemorrhoids are dilated (enlarged) veins around the rectum. Sometimes clots will form in the veins. This makes them swollen and painful. These are called thrombosed hemorrhoids. Causes of hemorrhoids include:  Constipation.   Straining to have a bowel movement.   HEAVY LIFTING  HOME CARE INSTRUCTIONS  Eat a well balanced diet and drink 6 to 8 glasses of water every day to avoid constipation. You may also use a bulk laxative.   Avoid straining to have bowel movements.   Keep anal area dry and clean.   Do not use a donut shaped pillow or sit on the toilet for long periods. This increases blood pooling and pain.   Move your bowels when your body has the urge; this will require less straining and will decrease pain and pressure.

## 2017-02-05 NOTE — Op Note (Signed)
[facility] Patient Name: Kristie Graham Procedure Date: 02/05/2017 10:19 AM MRN: [id number] Date of Birth: 11-11-1965 Attending MD: Barney Drain , MD CSN: [id number] Age: 51 Admit Type: Outpatient Procedure:                Colonoscopy, DIAGNOSTIC Indications:              Periumbilical abdominal pain, Hematochezia Providers:                Barney Drain, MD, Janeece Riggers, RN, Aram Candela Referring MD:             Redmond [employer], MD Medicines:                Propofol per Anesthesia Complications:            No immediate complications. Estimated Blood Loss:     Estimated blood loss: none. Procedure:                Pre-Anesthesia Assessment:                           - Prior to the procedure, a History and Physical                            was performed, and patient medications and                            allergies were reviewed. The patient's tolerance of                            previous anesthesia was also reviewed. The risks                            and benefits of the procedure and the sedation                            options and risks were discussed with the patient.                            All questions were answered, and informed consent                            was obtained. Prior Anticoagulants: The patient has                            taken ibuprofen, last dose was 1 day prior to                            procedure. ASA Grade Assessment: II - A patient                            with mild systemic disease. After reviewing the                            risks and benefits, the patient was deemed in  satisfactory condition to undergo the procedure.                            After obtaining informed consent, the colonoscope                            was passed under direct vision. Throughout the                            procedure, the patient's blood pressure, pulse, and                            oxygen saturations were  monitored continuously. The                            EC-[id number] ([id number]) scope was introduced through                            the anus and advanced to the 10 cm into the ileum.                            The terminal ileum, ileocecal valve, appendiceal                            orifice, and rectum were photographed. The                            colonoscopy was somewhat difficult due to a                            tortuous colon. Successful completion of the                            procedure was aided by straightening and shortening                            the scope to obtain bowel loop reduction and                            COLOWRAP. The patient tolerated the procedure well.                            The quality of the bowel preparation was excellent. Scope In: 10:58:27 AM Scope Out: 11:10:58 AM Scope Withdrawal Time: 0 hours 10 minutes 31 seconds  Total Procedure Duration: 0 hours 12 minutes 31 seconds  Findings:      The terminal ileum appeared normal.      The recto-sigmoid colon, sigmoid colon and descending colon were       moderately redundant.      Non-bleeding internal hemorrhoids were found during retroflexion. The       hemorrhoids were large.      Non-bleeding external hemorrhoids were found during retroflexion. The       hemorrhoids were moderate. Impression:               -  NO OBVIOUS REASON FOR DIARRHEA OR ABDOMINAL PAIN                            IDENTIFIED. IT IS MOST LIKELY DUE TO IBS/BILE SALT                            INDUCED DIARRHEA                           - Redundant LEFT colon.                           - Non-bleeding internal hemorrhoids.                           - Non-bleeding external hemorrhoids. Moderate Sedation:      Per Anesthesia Care Recommendation:           - Repeat colonoscopy in 5-10 years for surveillance.                           - High fiber diet and low fat diet.                           - Continue present  medications.                           - Patient has a contact number available for                            emergencies. The signs and symptoms of potential                            delayed complications were discussed with the                            patient. Return to normal activities tomorrow.                            Written discharge instructions were provided to the                            patient. Procedure Code(s):        --- Professional ---                           [phone], Colonoscopy, flexible; diagnostic, including                            collection of specimen(s) by brushing or washing,                            when performed (separate procedure) Diagnosis Code(s):        --- Professional ---                           X90.2,  Residual hemorrhoidal skin tags                           K64.8, Other hemorrhoids                           B63.84, Periumbilical pain                           K92.1, Melena (includes Hematochezia)                           Q43.8, Other specified congenital malformations of                            intestine CPT copyright 2016 [employer]. All rights reserved. The codes documented in this report are preliminary and upon coder review may  be revised to meet current compliance requirements. Barney Drain, MD Barney Drain, MD 02/05/2017 11:36:36 AM This report has been signed electronically. Number of Addenda: 0

## 2017-02-05 NOTE — Telephone Encounter (Signed)
Referral sent to Dr. Jenkins via Epic. °

## 2017-02-05 NOTE — Anesthesia Preprocedure Evaluation (Signed)
Anesthesia Evaluation  Patient identified by MRN, date of birth, ID band Patient awake    History of Anesthesia Complications (+) PONV and history of anesthetic complications  Airway Mallampati: II  TM Distance: >3 FB     Dental  (+) Teeth Intact   Pulmonary    breath sounds clear to auscultation       Cardiovascular hypertension, Pt. on medications  Rhythm:Regular Rate:Normal     Neuro/Psych  Headaches, Anxiety    GI/Hepatic GERD  ,  Endo/Other    Renal/GU      Musculoskeletal   Abdominal   Peds  Hematology   Anesthesia Other Findings   Reproductive/Obstetrics                             Anesthesia Physical Anesthesia Plan  ASA: II  Anesthesia Plan: MAC   Post-op Pain Management:    Induction: Intravenous  Airway Management Planned: Simple Face Mask  Additional Equipment:   Intra-op Plan:   Post-operative Plan:   Informed Consent: I have reviewed the patients History and Physical, chart, labs and discussed the procedure including the risks, benefits and alternatives for the proposed anesthesia with the patient or authorized representative who has indicated his/her understanding and acceptance.     Plan Discussed with:   Anesthesia Plan Comments:         Anesthesia Quick Evaluation

## 2017-02-05 NOTE — Transfer of Care (Signed)
Immediate Anesthesia Transfer of Care Note  Patient: Kristie Graham  Procedure(s) Performed: Procedure(s) with comments: COLONOSCOPY WITH PROPOFOL (N/A) - 10:30am ESOPHAGOGASTRODUODENOSCOPY (EGD) WITH PROPOFOL (N/A) BIOPSY - gastric duodenal  Patient Location: PACU  Anesthesia Type:MAC  Level of Consciousness: awake and patient cooperative  Airway & Oxygen Therapy: Patient Spontanous Breathing and Patient connected to face mask oxygen  Post-op Assessment: Report given to RN, Post -op Vital signs reviewed and stable and Patient moving all extremities  Post vital signs: Reviewed and stable  Last Vitals:  Vitals:   02/05/17 1035 02/05/17 1040  BP: 128/83 (!) 142/94  Pulse:    Resp: 15 16  Temp:      Last Pain:  Vitals:   02/05/17 0855  TempSrc: Oral      Patients Stated Pain Goal: 8 (69/48/54 6270)  Complications: No apparent anesthesia complications

## 2017-02-07 ENCOUNTER — Encounter (HOSPITAL_COMMUNITY): Payer: Self-pay | Admitting: Gastroenterology

## 2017-02-07 ENCOUNTER — Other Ambulatory Visit: Payer: Self-pay

## 2017-02-07 DIAGNOSIS — R9431 Abnormal electrocardiogram [ECG] [EKG]: Secondary | ICD-10-CM

## 2017-02-07 NOTE — Progress Notes (Signed)
LMOM to call.

## 2017-02-07 NOTE — Progress Notes (Signed)
APPT MADE AND ON RECALL  °

## 2017-02-07 NOTE — Progress Notes (Signed)
PT is aware.

## 2017-02-08 NOTE — Progress Notes (Signed)
Called. Many rings and no answer. Sending message via [place].

## 2017-02-11 ENCOUNTER — Ambulatory Visit: Payer: Self-pay | Admitting: Cardiology

## 2017-02-15 NOTE — Telephone Encounter (Signed)
REFERRED TO CARDIOLOGY. DECLINED TO SEE THEM.

## 2017-04-03 ENCOUNTER — Ambulatory Visit (INDEPENDENT_AMBULATORY_CARE_PROVIDER_SITE_OTHER): Payer: Commercial Managed Care - HMO | Admitting: Obstetrics and Gynecology

## 2017-04-03 ENCOUNTER — Encounter: Payer: Self-pay | Admitting: Obstetrics and Gynecology

## 2017-04-03 VITALS — BP 144/84 | HR 80 | Ht 63.0 in | Wt 221.0 lb

## 2017-04-03 DIAGNOSIS — N951 Menopausal and female climacteric states: Secondary | ICD-10-CM | POA: Diagnosis not present

## 2017-04-03 MED ORDER — ESTRADIOL 0.1 MG/24HR TD PTTW: 1.0000 | MEDICATED_PATCH | TRANSDERMAL | 12 refills | Status: AC

## 2017-04-03 MED ORDER — ESTRADIOL 0.1 MG/24HR TD PTTW: 1.0000 | MEDICATED_PATCH | TRANSDERMAL | 12 refills | Status: DC

## 2017-04-03 NOTE — Progress Notes (Signed)
Patient ID: Kristie Graham, female   DOB: 20-Nov-1965, 51 y.o.   MRN: [id number]   [facility] Visit  _0 @            Patient name: Kristie Graham MRN [id number]  Date of birth: June 21, 1966  CC & HPI:   Chief Complaint  Patient presents with  . Menopause    ? premenopause, hot flashes, moody, night sweats, breast tenderness, anxiety     Kristie Graham is a 51 y.o. female presenting today for fatigue, moodiness, hot flashes and night sweats. She states these symptoms occur sporadically, and states she must sleep with a fan at night or she will sweat. She is no longer having periods s/p ablation. Pt is a non-smoker.   ROS:  ROS +vasomotor symptoms   Pertinent History Reviewed:   Reviewed: Significant for tubal ligation, endometrial ablation  Medical         Past Medical History:  Diagnosis Date  . Anxiety   . Back pain    chronic  . Chronic back pain   . Chronic kidney disease    kidney stones  . Complication of anesthesia   . Enlarged thyroid 01/06/2016  . GERD (gastroesophageal reflux disease)   . Hypertension   . PONV (postoperative nausea and vomiting)   . Tension type headache 01/06/2016  . Vaginal Pap smear, abnormal   . Vitamin D deficiency 01/10/2016                              Surgical Hx:    Past Surgical History:  Procedure Laterality Date  . BIOPSY  11/26/2011   Procedure: BIOPSY;  Surgeon: Dorothyann Peng, MD;  Location: [facility];  Service: Endoscopy;;  Rectal polyp biopsy  . BIOPSY  02/05/2017   Procedure: BIOPSY;  Surgeon: Danie Binder, MD;  Location: [facility];  Service: Endoscopy;;  gastric duodenal  . CHOLECYSTECTOMY  2009  . COLONOSCOPY  10/2011   Dr. Oneida Alar: MAC. Multiple polyps in the rectum, tubular adenoma. Diverticulosis sigmoid colon, internal hemorrhoids.  . COLONOSCOPY WITH PROPOFOL N/A 02/05/2017   Procedure: COLONOSCOPY WITH PROPOFOL;  Surgeon: Danie Binder, MD;  Location: [facility];  Service: Endoscopy;  Laterality: N/A;   10:30am  . ENDOMETRIAL ABLATION  2011   [facility]  . ENDOMETRIAL BIOPSY  2010  . ESOPHAGOGASTRODUODENOSCOPY (EGD) WITH PROPOFOL N/A 02/05/2017   Procedure: ESOPHAGOGASTRODUODENOSCOPY (EGD) WITH PROPOFOL;  Surgeon: Danie Binder, MD;  Location: [facility];  Service: Endoscopy;  Laterality: N/A;  . TUBAL LIGATION  1995   Medications: Reviewed & Updated - see associated section                       Current Outpatient Prescriptions:  .  ALPRAZolam (XANAX) 1 MG tablet, Take 1 mg by mouth 2 (two) times daily as needed (for anxiety.). Anxiety, Disp: , Rfl:  .  Cholecalciferol (VITAMIN D3 PO), Take 1 tablet by mouth daily., Disp: , Rfl:  .  IBU 800 MG tablet, Take 800 mg by mouth 3 (three) times daily as needed for pain., Disp: , Rfl:  .  losartan (COZAAR) 25 MG tablet, Take 25 mg by mouth daily. , Disp: , Rfl:  .  pantoprazole (PROTONIX) 40 MG tablet, 1 po 30 mins prior to first meal, Disp: 30 tablet, Rfl: 11 .  ranitidine (ZANTAC) 150 MG tablet, 1 po bid if needed  for heartburn, Disp: 60 tablet, Rfl: 11   Social History: Reviewed -  reports that she has never smoked. She has never used smokeless tobacco.  Objective Findings:  Vitals: There were no vitals taken for this visit.  Physical Examination: Discussion only   Discussion: 1. Discussed with pt current information, studies and literature available on risks and benefits of hormone therapy, specifically in regards to estrogen. Discussed slightly increased risk of PE/DVT and endometrial tissue build up.   At end of discussion, pt had opportunity to ask questions and has no further questions at this time.   Specific discussion of hormone therapy as noted above. Greater than 50% was spent in counseling and coordination of care with the patient.   Total time greater than: 15 minutes.     Assessment & Plan:   A:  1. Vasomotor symptoms   P:  1.  2. Rx Vivelle Dot after labs result  3. F/u in 2 months for ROS and withdrawal with Provera    Pt declines fsh and estradiol testing; will follow rymptomatically  By signing my name below, I, Hansel Feinstein, attest that this documentation has been prepared under the direction and in the presence of Jonnie Kind, MD. Electronically Signed: Hansel Feinstein, ED Scribe. 04/03/17. 4:41 PM.  I personally performed the services described in this documentation, which was SCRIBED in my presence. The recorded information has been reviewed and considered accurate. It has been edited as necessary during review. Jonnie Kind, MD

## 2017-04-11 ENCOUNTER — Other Ambulatory Visit: Payer: Self-pay | Admitting: Orthopedic Surgery

## 2017-04-11 DIAGNOSIS — M25561 Pain in right knee: Secondary | ICD-10-CM

## 2017-04-23 ENCOUNTER — Ambulatory Visit
Admission: RE | Admit: 2017-04-23 | Discharge: 2017-04-23 | Disposition: A | Discharge status: Home / Self Care | Payer: 59 | Source: Ambulatory Visit | Attending: Orthopedic Surgery | Admitting: Orthopedic Surgery

## 2017-04-23 DIAGNOSIS — M25561 Pain in right knee: Secondary | ICD-10-CM

## 2017-06-07 ENCOUNTER — Telehealth: Payer: Self-pay | Admitting: Gastroenterology

## 2017-06-07 ENCOUNTER — Encounter: Payer: Self-pay | Admitting: Gastroenterology

## 2017-06-07 ENCOUNTER — Ambulatory Visit: Payer: 59 | Admitting: Gastroenterology

## 2017-06-07 NOTE — Telephone Encounter (Signed)
Pt was a no show

## 2017-06-07 NOTE — Telephone Encounter (Signed)
LETTER MAILED

## 2017-06-18 ENCOUNTER — Ambulatory Visit (HOSPITAL_COMMUNITY)
Admission: RE | Admit: 2017-06-18 | Discharge: 2017-06-18 | Disposition: A | Discharge status: Home / Self Care | Payer: 59 | Source: Ambulatory Visit | Attending: Internal Medicine | Admitting: Internal Medicine

## 2017-06-18 ENCOUNTER — Other Ambulatory Visit (HOSPITAL_COMMUNITY): Payer: Self-pay | Admitting: Internal Medicine

## 2017-06-18 DIAGNOSIS — M79604 Pain in right leg: Secondary | ICD-10-CM | POA: Insufficient documentation

## 2017-06-18 DIAGNOSIS — M79605 Pain in left leg: Secondary | ICD-10-CM | POA: Insufficient documentation

## 2017-06-18 DIAGNOSIS — R600 Localized edema: Secondary | ICD-10-CM | POA: Diagnosis not present

## 2017-06-21 IMAGING — US US SOFT TISSUE HEAD/NECK
1 series · 14 of 25 positions shown · non-contrast
Comparison: None.

CLINICAL DATA: Thyromegaly on physical exam

EXAM:
THYROID ULTRASOUND
TECHNIQUE: Ultrasound examination of the thyroid gland and adjacent soft
tissues was performed.

[Series 1: us soft tissue head/neck · 0.06mm/px · 14 of 53 slices shown]
[im 1/53]
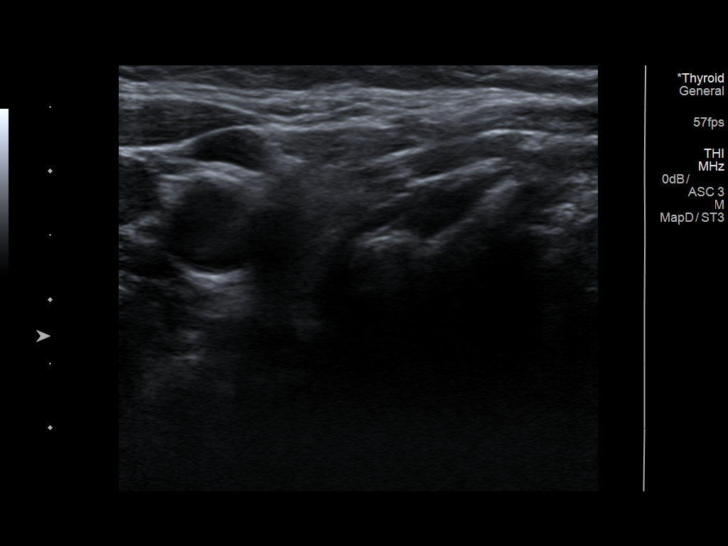
[im 5/53]
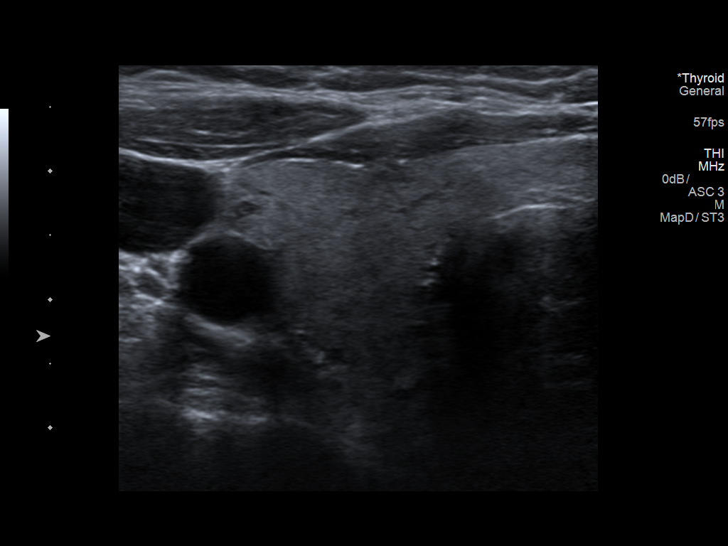
[im 9/53]
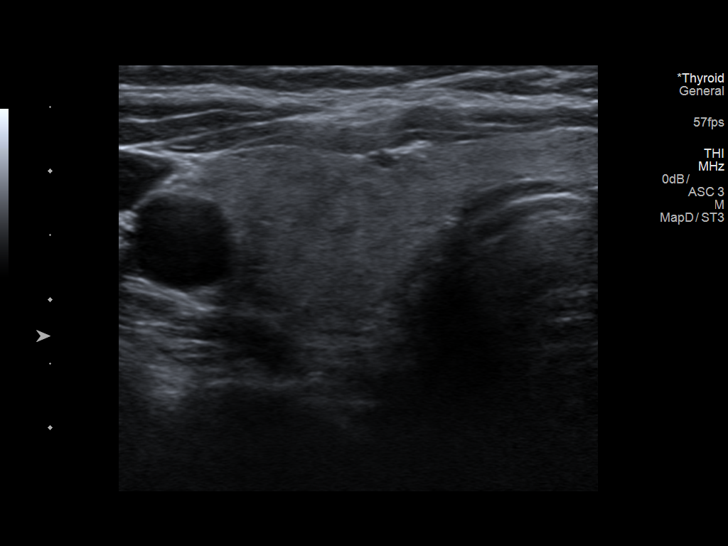
[im 14/53]
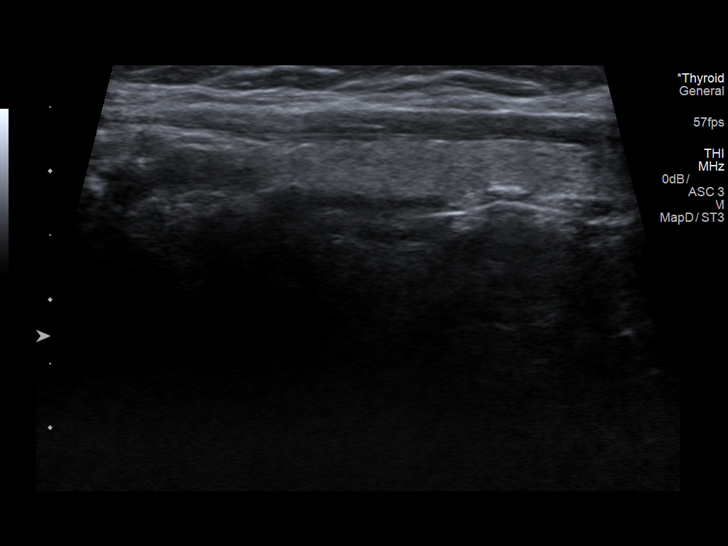
[im 18/53]
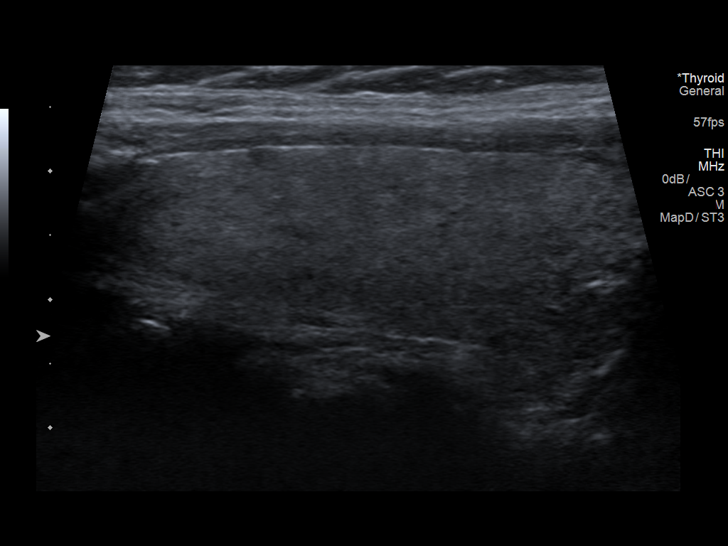
[im 20/53]
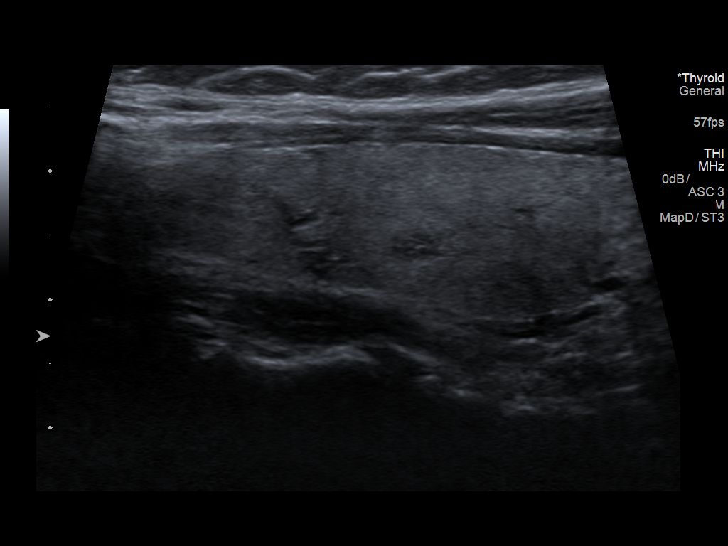
[im 24/53]
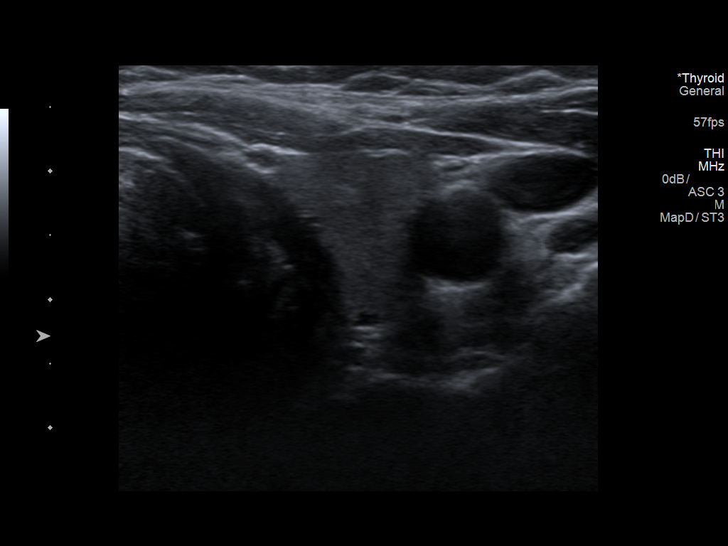
[im 29/53]
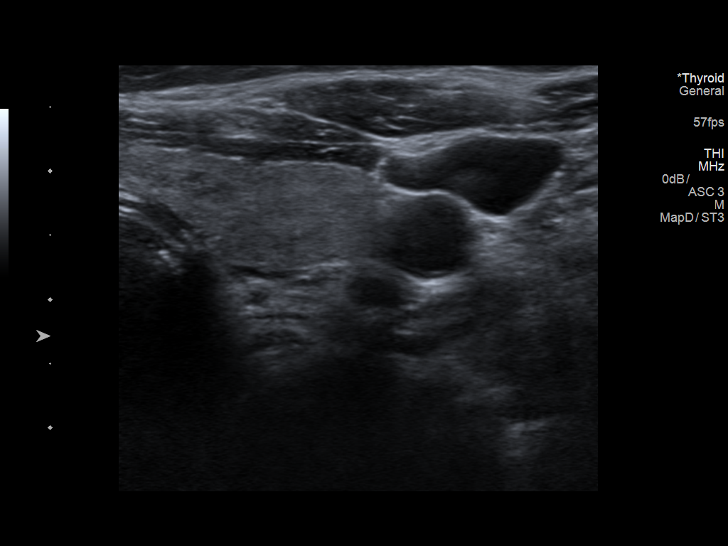
[im 33/53]
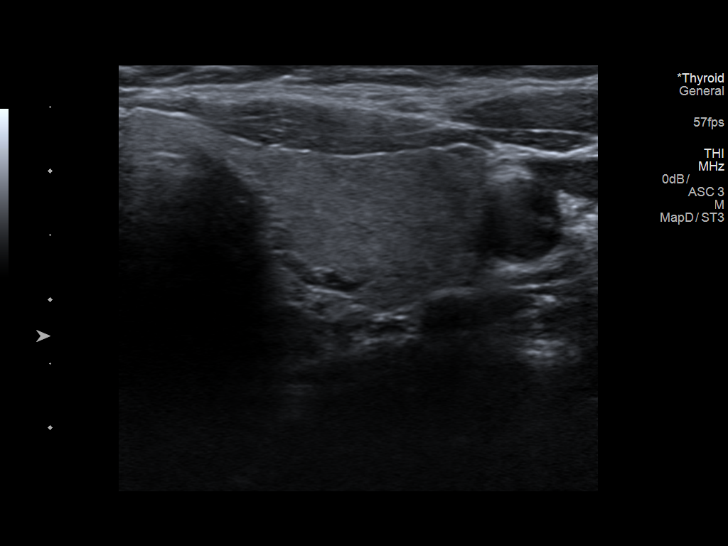
[im 35/53]
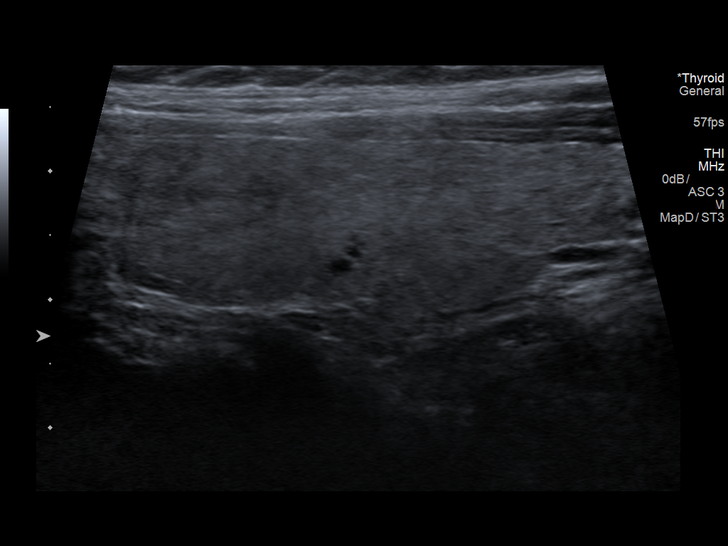
[im 40/53]
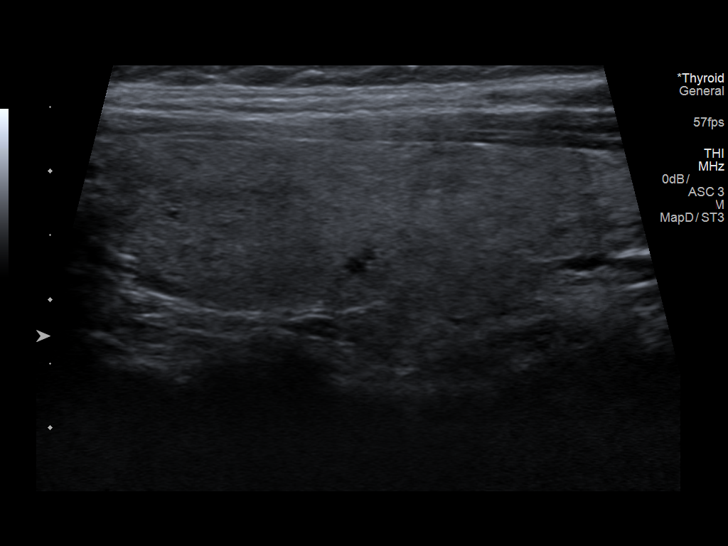
[im 44/53]
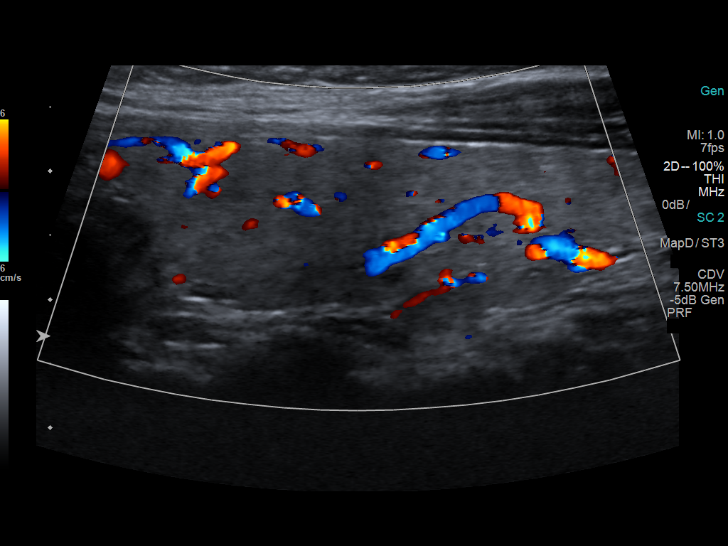
[im 48/53]
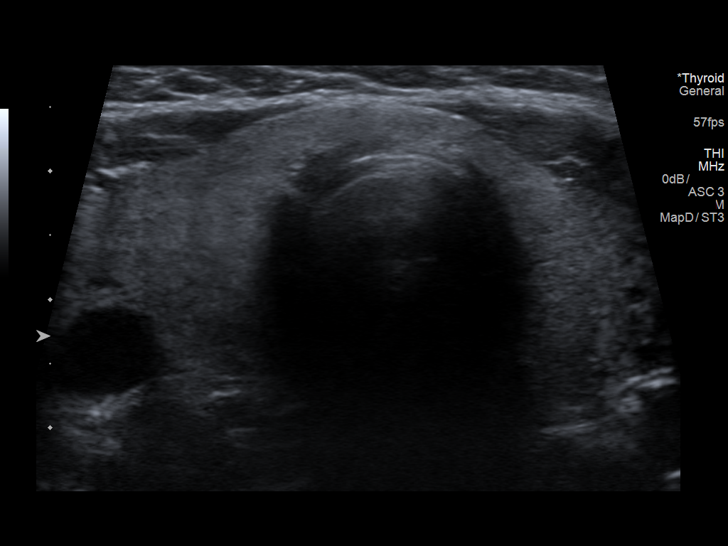
[im 53/53]
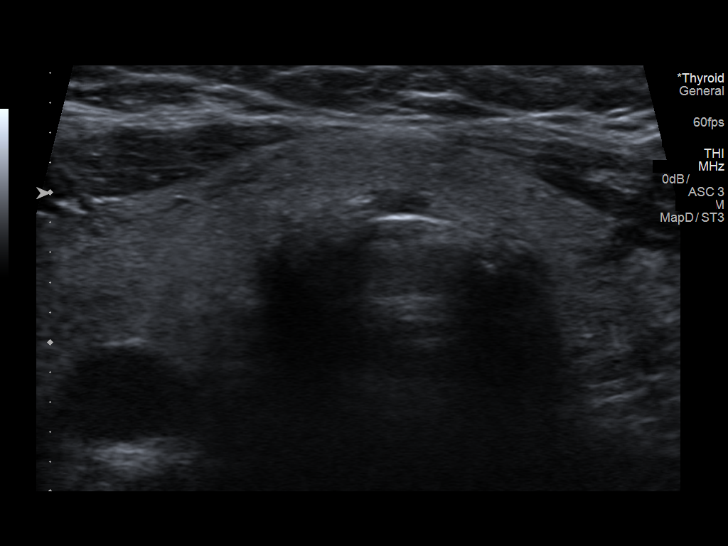

[14 of 25 positions shown; findings below may reference images not displayed]

FINDINGS: Right thyroid lobe

Measurements: 46 x 15 x 21 mm. Fairly homogeneous background
echotexture. No nodules visualized.

Left thyroid lobe

Measurements: 44 x 15 x 18 mm.  No nodules visualized.

Isthmus

Thickness: 4 mm.  No nodules visualized.

Lymphadenopathy

None visualized.
IMPRESSION: Normal thyroid.

## 2017-07-04 ENCOUNTER — Ambulatory Visit: Payer: Commercial Managed Care - HMO | Admitting: Obstetrics and Gynecology

## 2018-05-30 ENCOUNTER — Telehealth: Payer: Self-pay | Admitting: Adult Health

## 2018-05-30 NOTE — Telephone Encounter (Signed)
Pt called asking if she had ever received the HPV vaccine from [place]. Advised pt that it is not recorded in her history. Pt asks what the recommended age for that vaccine is. Advised that is starts around 50 years of age through your early 26s. Pt verbalized understanding.

## 2018-05-30 NOTE — Telephone Encounter (Signed)
Patient called stating that she has a question regarding a lab. Pt would like to know if she has a certain lab done she doesn't see it on her Mychart. Please contact pt

## 2018-08-13 DIAGNOSIS — Z1389 Encounter for screening for other disorder: Secondary | ICD-10-CM | POA: Diagnosis not present

## 2018-08-13 DIAGNOSIS — J9801 Acute bronchospasm: Secondary | ICD-10-CM | POA: Diagnosis not present

## 2018-08-13 DIAGNOSIS — J329 Chronic sinusitis, unspecified: Secondary | ICD-10-CM | POA: Diagnosis not present

## 2018-08-13 DIAGNOSIS — R490 Dysphonia: Secondary | ICD-10-CM | POA: Diagnosis not present

## 2018-08-13 DIAGNOSIS — J042 Acute laryngotracheitis: Secondary | ICD-10-CM | POA: Diagnosis not present

## 2018-11-05 DIAGNOSIS — M1991 Primary osteoarthritis, unspecified site: Secondary | ICD-10-CM | POA: Diagnosis not present

## 2018-11-05 DIAGNOSIS — Z1389 Encounter for screening for other disorder: Secondary | ICD-10-CM | POA: Diagnosis not present

## 2018-11-05 DIAGNOSIS — F419 Anxiety disorder, unspecified: Secondary | ICD-10-CM | POA: Diagnosis not present

## 2018-11-05 DIAGNOSIS — M5417 Radiculopathy, lumbosacral region: Secondary | ICD-10-CM | POA: Diagnosis not present

## 2018-11-05 DIAGNOSIS — Z6841 Body Mass Index (BMI) 40.0 and over, adult: Secondary | ICD-10-CM | POA: Diagnosis not present

## 2018-11-05 DIAGNOSIS — G894 Chronic pain syndrome: Secondary | ICD-10-CM | POA: Diagnosis not present

## 2018-11-18 ENCOUNTER — Other Ambulatory Visit (HOSPITAL_COMMUNITY): Payer: Self-pay | Admitting: Physician Assistant

## 2018-11-18 DIAGNOSIS — M5417 Radiculopathy, lumbosacral region: Secondary | ICD-10-CM

## 2018-11-25 ENCOUNTER — Ambulatory Visit (HOSPITAL_COMMUNITY)
Admission: RE | Admit: 2018-11-25 | Discharge: 2018-11-25 | Disposition: A | Discharge status: Home / Self Care | Payer: 59 | Source: Ambulatory Visit | Attending: Physician Assistant | Admitting: Physician Assistant

## 2018-11-25 DIAGNOSIS — M5116 Intervertebral disc disorders with radiculopathy, lumbar region: Secondary | ICD-10-CM | POA: Diagnosis not present

## 2018-11-25 DIAGNOSIS — M5417 Radiculopathy, lumbosacral region: Secondary | ICD-10-CM | POA: Insufficient documentation

## 2018-11-25 DIAGNOSIS — M48061 Spinal stenosis, lumbar region without neurogenic claudication: Secondary | ICD-10-CM | POA: Diagnosis not present

## 2018-11-27 IMAGING — US US EXTREM LOW VENOUS BILAT
1 series · 13 of 24 positions shown · non-contrast
Comparison: None.

CLINICAL DATA: Bilateral leg pain



[Series 1: us extrem low venous bilat · 0.07mm/px · 13 of 69 slices shown]
[im 1/69]
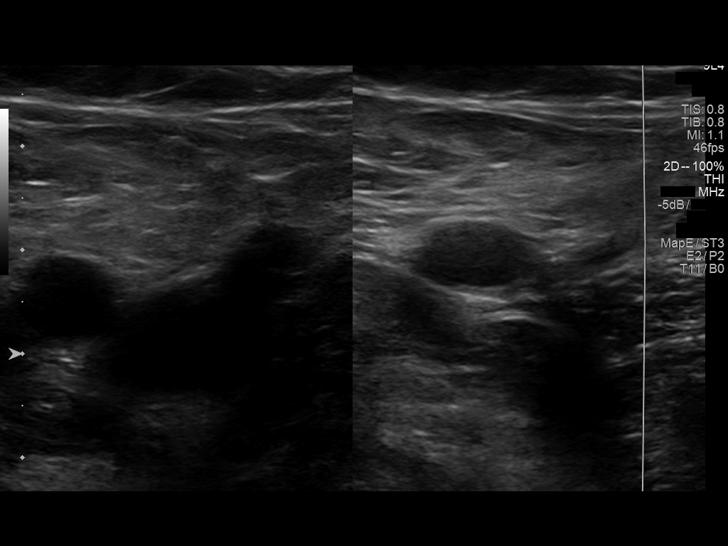
[im 6/69]
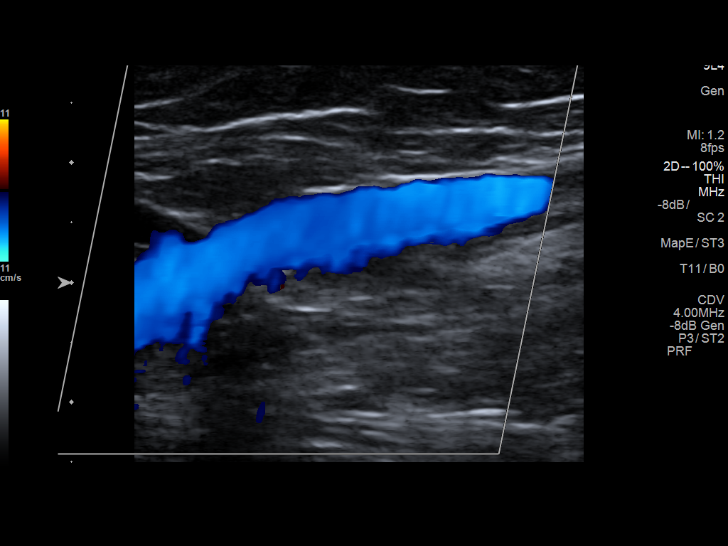
[im 12/69]
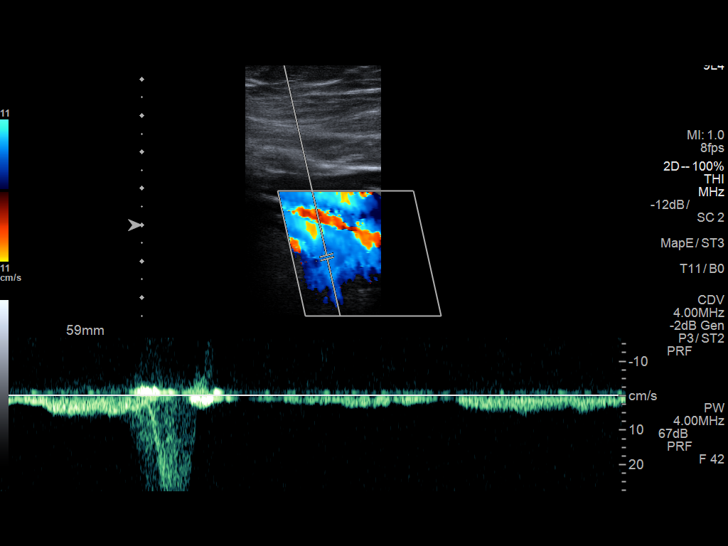
[im 18/69]
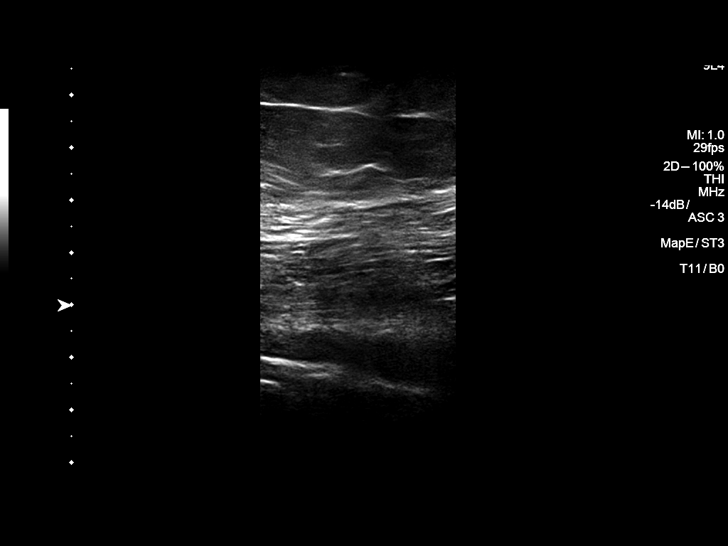
[im 24/69]
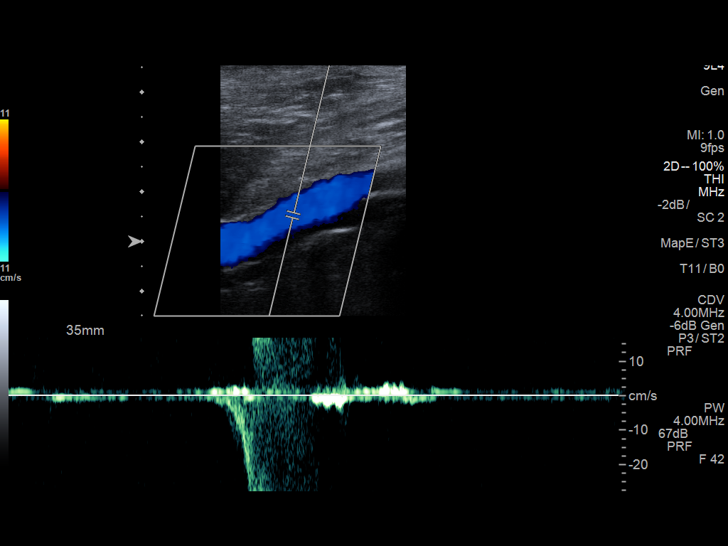
[im 30/69]
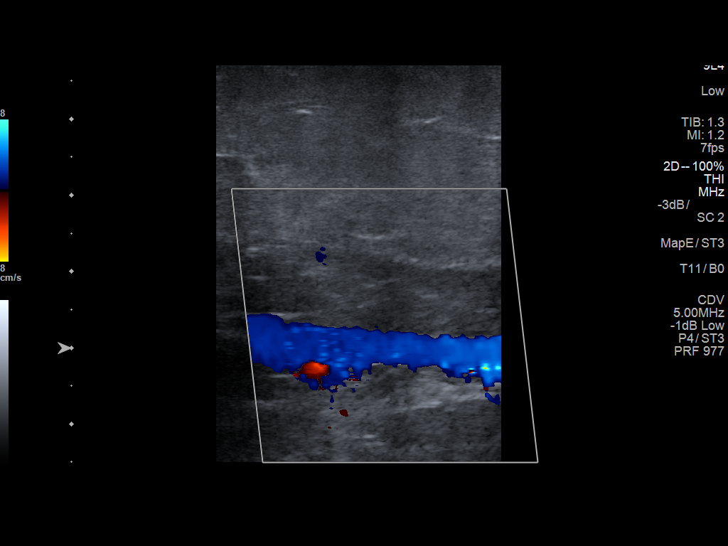
[im 36/69]
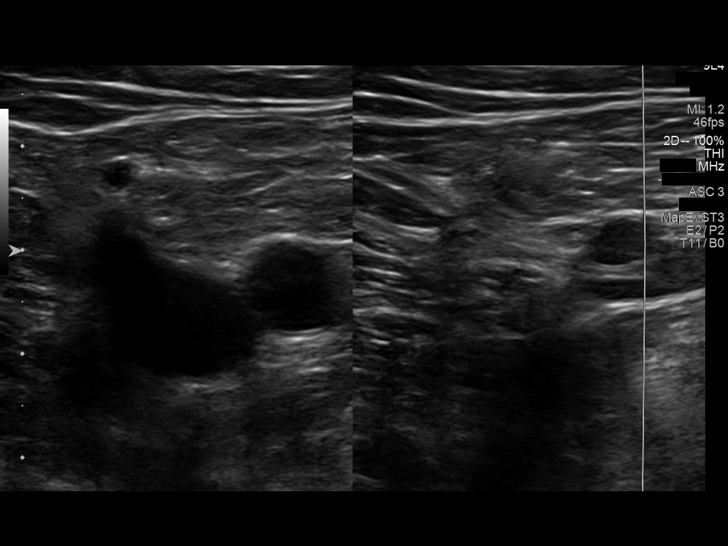
[im 39/69]
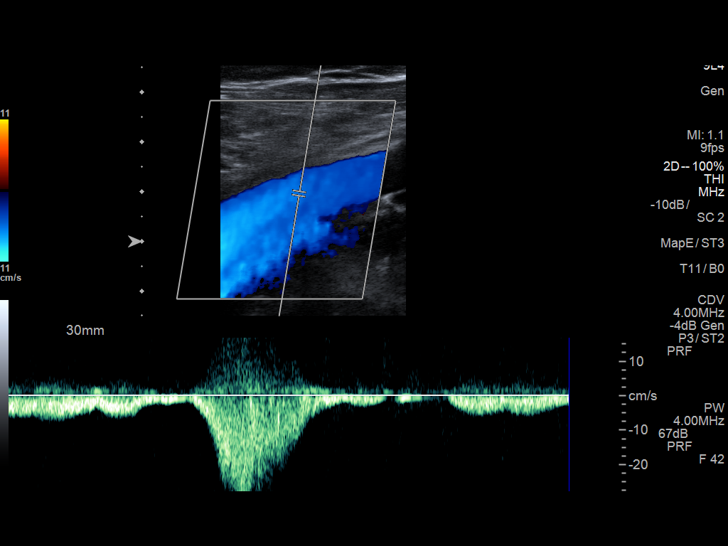
[im 45/69]
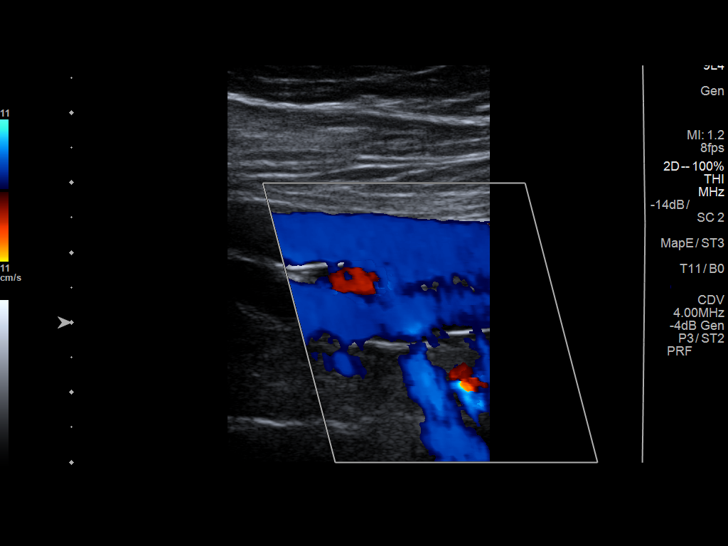
[im 51/69]
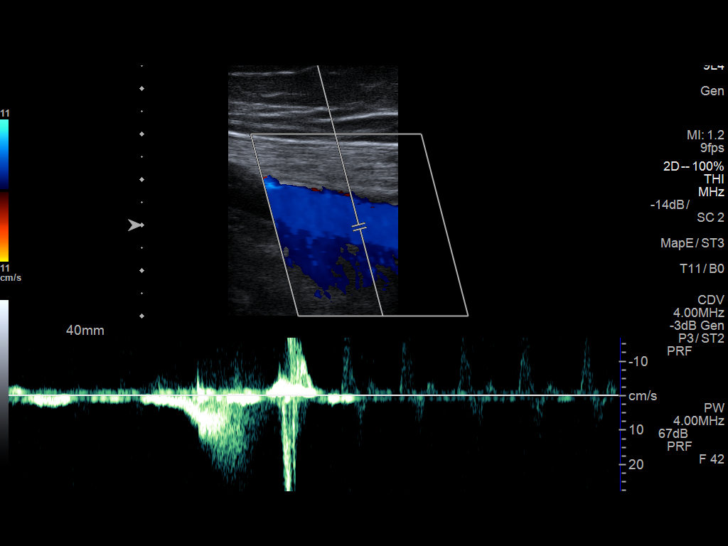
[im 57/69]
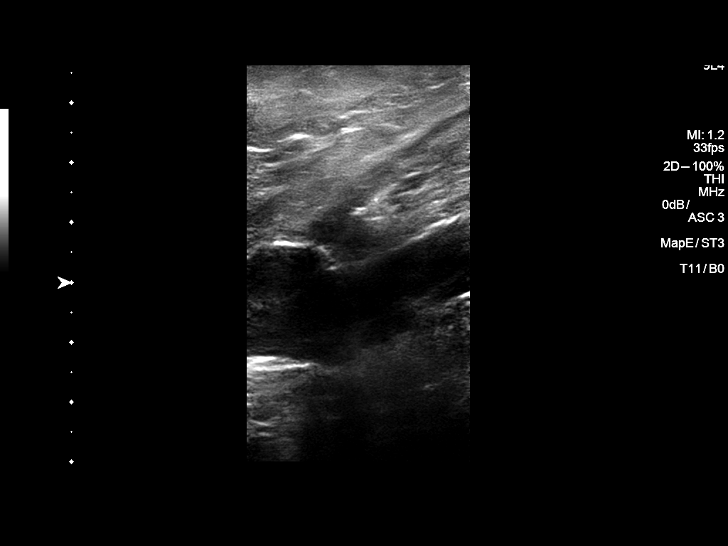
[im 63/69]
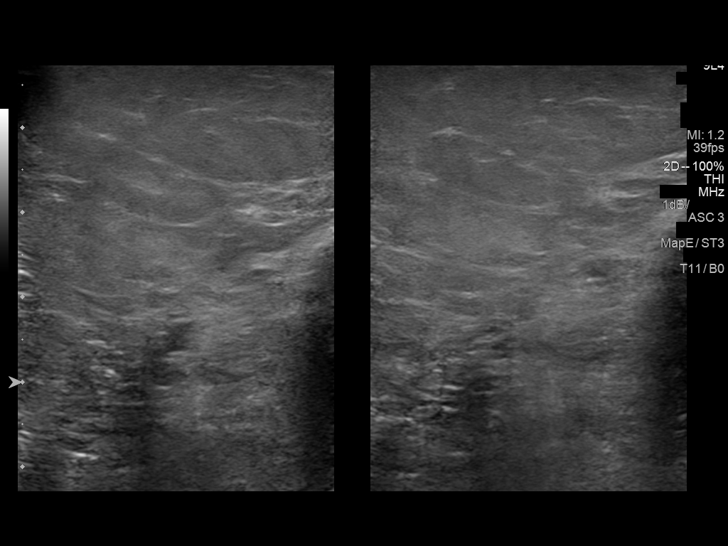
[im 69/69]
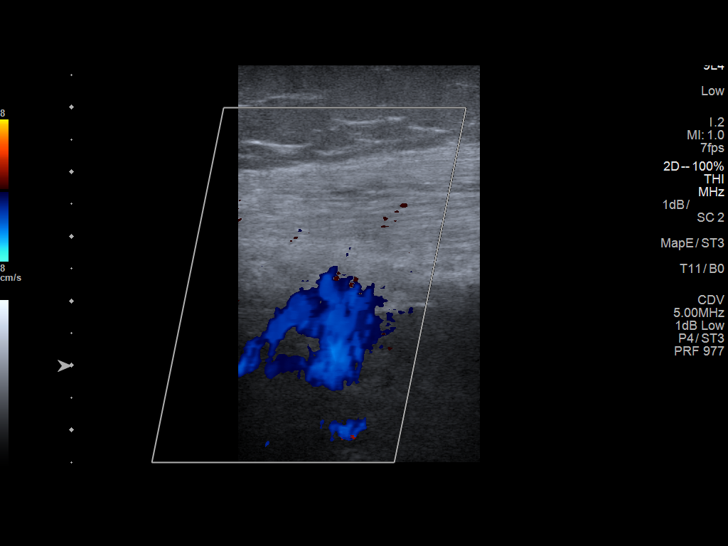

[13 of 24 positions shown; findings below may reference images not displayed]

FINDINGS: RIGHT LOWER EXTREMITY

Common Femoral Vein: No evidence of thrombus. Normal
compressibility, respiratory phasicity and response to augmentation.

Saphenofemoral Junction: No evidence of thrombus. Normal
compressibility and flow on color Doppler imaging.

Profunda Femoral Vein: No evidence of thrombus. Normal
compressibility and flow on color Doppler imaging.

Femoral Vein: No evidence of thrombus. Normal compressibility,
respiratory phasicity and response to augmentation.

Popliteal Vein: No evidence of thrombus. Normal compressibility,
respiratory phasicity and response to augmentation.

Calf Veins: No evidence of thrombus. Normal compressibility and flow
on color Doppler imaging.

Superficial Great Saphenous Vein: No evidence of thrombus. Normal
compressibility and flow on color Doppler imaging.

Venous Reflux:  None.

Other Findings:  Subcutaneous edema in the calf.

LEFT LOWER EXTREMITY

Common Femoral Vein: No evidence of thrombus. Normal
compressibility, respiratory phasicity and response to augmentation.

Saphenofemoral Junction: No evidence of thrombus. Normal
compressibility and flow on color Doppler imaging.

Profunda Femoral Vein: No evidence of thrombus. Normal
compressibility and flow on color Doppler imaging.

Femoral Vein: No evidence of thrombus. Normal compressibility,
respiratory phasicity and response to augmentation.

Popliteal Vein: No evidence of thrombus. Normal compressibility,
respiratory phasicity and response to augmentation.

Calf Veins: No evidence of thrombus. Normal compressibility and flow
on color Doppler imaging.

Superficial Great Saphenous Vein: No evidence of thrombus. Normal
compressibility and flow on color Doppler imaging.

Venous Reflux:  None.

Other Findings:  Subcutaneous edema in the calf.
IMPRESSION: 1. No evidence of DVT within either lower extremity.
2. Subcutaneous edema in the calves.

## 2018-12-01 DIAGNOSIS — M545 Low back pain: Secondary | ICD-10-CM | POA: Diagnosis not present

## 2018-12-01 DIAGNOSIS — Z6841 Body Mass Index (BMI) 40.0 and over, adult: Secondary | ICD-10-CM | POA: Diagnosis not present

## 2018-12-18 DIAGNOSIS — J329 Chronic sinusitis, unspecified: Secondary | ICD-10-CM | POA: Diagnosis not present

## 2018-12-18 DIAGNOSIS — Z1389 Encounter for screening for other disorder: Secondary | ICD-10-CM | POA: Diagnosis not present

## 2018-12-18 DIAGNOSIS — K219 Gastro-esophageal reflux disease without esophagitis: Secondary | ICD-10-CM | POA: Diagnosis not present

## 2018-12-18 DIAGNOSIS — I10 Essential (primary) hypertension: Secondary | ICD-10-CM | POA: Diagnosis not present

## 2018-12-18 DIAGNOSIS — F419 Anxiety disorder, unspecified: Secondary | ICD-10-CM | POA: Diagnosis not present

## 2018-12-18 DIAGNOSIS — Z6841 Body Mass Index (BMI) 40.0 and over, adult: Secondary | ICD-10-CM | POA: Diagnosis not present

## 2018-12-18 DIAGNOSIS — N3000 Acute cystitis without hematuria: Secondary | ICD-10-CM | POA: Diagnosis not present

## 2019-01-03 DIAGNOSIS — J029 Acute pharyngitis, unspecified: Secondary | ICD-10-CM | POA: Diagnosis not present

## 2019-01-03 DIAGNOSIS — R6889 Other general symptoms and signs: Secondary | ICD-10-CM | POA: Diagnosis not present

## 2019-01-03 DIAGNOSIS — J069 Acute upper respiratory infection, unspecified: Secondary | ICD-10-CM | POA: Diagnosis not present

## 2019-03-13 DIAGNOSIS — Z1389 Encounter for screening for other disorder: Secondary | ICD-10-CM | POA: Diagnosis not present

## 2019-03-13 DIAGNOSIS — Z6841 Body Mass Index (BMI) 40.0 and over, adult: Secondary | ICD-10-CM | POA: Diagnosis not present

## 2019-03-13 DIAGNOSIS — F419 Anxiety disorder, unspecified: Secondary | ICD-10-CM | POA: Diagnosis not present

## 2019-03-13 DIAGNOSIS — G894 Chronic pain syndrome: Secondary | ICD-10-CM | POA: Diagnosis not present

## 2019-04-14 DIAGNOSIS — M159 Polyosteoarthritis, unspecified: Secondary | ICD-10-CM | POA: Diagnosis not present

## 2019-04-14 DIAGNOSIS — Z1389 Encounter for screening for other disorder: Secondary | ICD-10-CM | POA: Diagnosis not present

## 2019-04-14 DIAGNOSIS — Z6841 Body Mass Index (BMI) 40.0 and over, adult: Secondary | ICD-10-CM | POA: Diagnosis not present

## 2019-04-14 DIAGNOSIS — R52 Pain, unspecified: Secondary | ICD-10-CM | POA: Diagnosis not present

## 2019-04-14 DIAGNOSIS — G894 Chronic pain syndrome: Secondary | ICD-10-CM | POA: Diagnosis not present

## 2019-05-12 DIAGNOSIS — R52 Pain, unspecified: Secondary | ICD-10-CM | POA: Diagnosis not present

## 2019-05-12 DIAGNOSIS — G894 Chronic pain syndrome: Secondary | ICD-10-CM | POA: Diagnosis not present

## 2019-05-12 DIAGNOSIS — M159 Polyosteoarthritis, unspecified: Secondary | ICD-10-CM | POA: Diagnosis not present

## 2019-06-11 DIAGNOSIS — M159 Polyosteoarthritis, unspecified: Secondary | ICD-10-CM | POA: Diagnosis not present

## 2019-06-11 DIAGNOSIS — G894 Chronic pain syndrome: Secondary | ICD-10-CM | POA: Diagnosis not present

## 2019-07-08 DIAGNOSIS — G894 Chronic pain syndrome: Secondary | ICD-10-CM | POA: Diagnosis not present

## 2019-07-08 DIAGNOSIS — E669 Obesity, unspecified: Secondary | ICD-10-CM | POA: Diagnosis not present

## 2019-07-08 DIAGNOSIS — M159 Polyosteoarthritis, unspecified: Secondary | ICD-10-CM | POA: Diagnosis not present

## 2019-08-06 DIAGNOSIS — G894 Chronic pain syndrome: Secondary | ICD-10-CM | POA: Diagnosis not present

## 2019-09-03 DIAGNOSIS — M159 Polyosteoarthritis, unspecified: Secondary | ICD-10-CM | POA: Diagnosis not present

## 2019-09-03 DIAGNOSIS — G894 Chronic pain syndrome: Secondary | ICD-10-CM | POA: Diagnosis not present

## 2019-10-06 DIAGNOSIS — G894 Chronic pain syndrome: Secondary | ICD-10-CM | POA: Diagnosis not present

## 2019-11-05 DIAGNOSIS — B349 Viral infection, unspecified: Secondary | ICD-10-CM | POA: Diagnosis not present

## 2019-12-03 DIAGNOSIS — G894 Chronic pain syndrome: Secondary | ICD-10-CM | POA: Diagnosis not present

## 2019-12-31 DIAGNOSIS — G894 Chronic pain syndrome: Secondary | ICD-10-CM | POA: Diagnosis not present

## 2020-02-25 DIAGNOSIS — G894 Chronic pain syndrome: Secondary | ICD-10-CM | POA: Diagnosis not present

## 2020-03-22 DIAGNOSIS — Z6841 Body Mass Index (BMI) 40.0 and over, adult: Secondary | ICD-10-CM | POA: Diagnosis not present

## 2020-03-22 DIAGNOSIS — Z0001 Encounter for general adult medical examination with abnormal findings: Secondary | ICD-10-CM | POA: Diagnosis not present

## 2020-03-22 DIAGNOSIS — G894 Chronic pain syndrome: Secondary | ICD-10-CM | POA: Diagnosis not present

## 2020-03-22 DIAGNOSIS — F419 Anxiety disorder, unspecified: Secondary | ICD-10-CM | POA: Diagnosis not present

## 2020-03-22 DIAGNOSIS — Z1389 Encounter for screening for other disorder: Secondary | ICD-10-CM | POA: Diagnosis not present

## 2020-03-22 DIAGNOSIS — M79672 Pain in left foot: Secondary | ICD-10-CM | POA: Diagnosis not present

## 2020-03-22 DIAGNOSIS — Z0000 Encounter for general adult medical examination without abnormal findings: Secondary | ICD-10-CM | POA: Diagnosis not present

## 2020-04-28 DIAGNOSIS — G894 Chronic pain syndrome: Secondary | ICD-10-CM | POA: Diagnosis not present

## 2020-04-28 DIAGNOSIS — M159 Polyosteoarthritis, unspecified: Secondary | ICD-10-CM | POA: Diagnosis not present

## 2020-05-18 DIAGNOSIS — G894 Chronic pain syndrome: Secondary | ICD-10-CM | POA: Diagnosis not present

## 2020-05-23 ENCOUNTER — Other Ambulatory Visit: Payer: Self-pay | Admitting: Obstetrics and Gynecology

## 2020-05-23 DIAGNOSIS — Z1231 Encounter for screening mammogram for malignant neoplasm of breast: Secondary | ICD-10-CM

## 2020-05-27 ENCOUNTER — Ambulatory Visit: Payer: 59

## 2020-05-31 ENCOUNTER — Other Ambulatory Visit: Payer: 59 | Admitting: Obstetrics & Gynecology

## 2020-06-08 ENCOUNTER — Other Ambulatory Visit: Payer: 59 | Admitting: Adult Health

## 2021-09-06 ENCOUNTER — Other Ambulatory Visit: Payer: Self-pay

## 2021-09-06 ENCOUNTER — Other Ambulatory Visit (HOSPITAL_COMMUNITY): Payer: Self-pay | Admitting: Internal Medicine

## 2021-09-06 ENCOUNTER — Ambulatory Visit (HOSPITAL_COMMUNITY)
Admission: RE | Admit: 2021-09-06 | Discharge: 2021-09-06 | Disposition: A | Discharge status: Home / Self Care | Payer: 59 | Source: Ambulatory Visit | Attending: Internal Medicine | Admitting: Internal Medicine

## 2021-09-06 DIAGNOSIS — M79672 Pain in left foot: Secondary | ICD-10-CM | POA: Diagnosis present

## 2022-02-13 ENCOUNTER — Encounter: Payer: Self-pay | Admitting: *Deleted

## 2022-02-27 ENCOUNTER — Emergency Department: Payer: No Typology Code available for payment source

## 2022-02-27 ENCOUNTER — Other Ambulatory Visit: Payer: Self-pay

## 2022-02-27 ENCOUNTER — Emergency Department
Admission: EM | Admit: 2022-02-27 | Discharge: 2022-02-27 | Disposition: A | Discharge status: Home / Self Care | Payer: No Typology Code available for payment source | Attending: Emergency Medicine | Admitting: Emergency Medicine

## 2022-02-27 DIAGNOSIS — R110 Nausea: Secondary | ICD-10-CM | POA: Insufficient documentation

## 2022-02-27 DIAGNOSIS — R0789 Other chest pain: Secondary | ICD-10-CM | POA: Diagnosis present

## 2022-02-27 DIAGNOSIS — K219 Gastro-esophageal reflux disease without esophagitis: Secondary | ICD-10-CM | POA: Insufficient documentation

## 2022-02-27 LAB — CBC WITH DIFFERENTIAL/PLATELET
Abs Immature Granulocytes: 0.02 10*3/uL (ref 0.00–0.07)
Basophils Absolute: 0 10*3/uL (ref 0.0–0.1)
Basophils Relative: 0 %
Eosinophils Absolute: 0.1 10*3/uL (ref 0.0–0.5)
Eosinophils Relative: 1 %
HCT: 39.1 % (ref 36.0–46.0)
Hemoglobin: 12.6 g/dL (ref 12.0–15.0)
Immature Granulocytes: 0 %
Lymphocytes Relative: 32 %
Lymphs Abs: 2.5 10*3/uL (ref 0.7–4.0)
MCH: 30.6 pg (ref 26.0–34.0)
MCHC: 32.2 g/dL (ref 30.0–36.0)
MCV: 94.9 fL (ref 80.0–100.0)
Monocytes Absolute: 0.4 10*3/uL (ref 0.1–1.0)
Monocytes Relative: 5 %
Neutro Abs: 4.8 10*3/uL (ref 1.7–7.7)
Neutrophils Relative %: 62 %
Platelets: 299 10*3/uL (ref 150–400)
RBC: 4.12 MIL/uL (ref 3.87–5.11)
RDW: 12.7 % (ref 11.5–15.5)
WBC: 7.7 10*3/uL (ref 4.0–10.5)
nRBC: 0 % (ref 0.0–0.2)

## 2022-02-27 LAB — COMPREHENSIVE METABOLIC PANEL
ALT: 18 U/L (ref 0–44)
AST: 16 U/L (ref 15–41)
Albumin: 3.6 g/dL (ref 3.5–5.0)
Alkaline Phosphatase: 65 U/L (ref 38–126)
Anion gap: 6 (ref 5–15)
BUN: 15 mg/dL (ref 6–20)
CO2: 27 mmol/L (ref 22–32)
Calcium: 9.3 mg/dL (ref 8.9–10.3)
Chloride: 104 mmol/L (ref 98–111)
Creatinine, Ser: 0.81 mg/dL (ref 0.44–1.00)
GFR, Estimated: >60 mL/min (ref >60)
Glucose, Bld: 105 mg/dL — ABNORMAL HIGH (ref 70–99)
Potassium: 3.9 mmol/L (ref 3.5–5.1)
Sodium: 137 mmol/L (ref 135–145)
Total Bilirubin: 0.5 mg/dL (ref 0.3–1.2)
Total Protein: 7.2 g/dL (ref 6.5–8.1)

## 2022-02-27 LAB — TROPONIN I (HIGH SENSITIVITY): Troponin I (High Sensitivity): 4 ng/L (ref <18)

## 2022-02-27 MED ORDER — LIDOCAINE VISCOUS HCL 2 % MT SOLN
15.0000 mL | Freq: Once | OROMUCOSAL | Status: AC
  Administered 2022-02-27: 15 mL via ORAL

## 2022-02-27 MED ORDER — ALUM & MAG HYDROXIDE-SIMETH 200-200-20 MG/5ML PO SUSP
30.0000 mL | Freq: Once | ORAL | Status: AC
  Administered 2022-02-27: 30 mL via ORAL

## 2022-02-27 MED ORDER — PANTOPRAZOLE SODIUM 40 MG PO TBEC: 40.0000 mg | DELAYED_RELEASE_TABLET | Freq: Every morning | ORAL | 0 refills | Status: AC

## 2022-02-27 MED ORDER — SUCRALFATE 1 G PO TABS: 1.0000 g | ORAL_TABLET | Freq: Three times a day (TID) | ORAL | 0 refills | Status: AC

## 2022-02-27 NOTE — ED Provider Notes (Signed)
? ?[facility] ?Provider Note ? ? ? Event Date/Time  ? First MD Initiated Contact with Patient 02/27/22 [id number]   ?  (approximate) ? ? ?History  ? ?Chest Pain ? ? ?HPI ? ?Kristie Graham is a 56 y.o. female here with chest pain.  The patient states that earlier this morning, before work, she experienced acute onset of dull, aching, chest pressure.  She had associated mild nausea and a belching sensation.  She went to work, took some Tums, and it mildly improved.  However, it then returned.  She took Tums and it did not resolve so she presents for further evaluation.  She has a history of reflux and states it was similar.  However, symptoms are more severe.  She did eat pizza last night.  Denies known history of coronary disease.  Denies smoking history.  She has minimal pain at this time.  No diaphoresis.  No shortness of breath.  No leg swelling.  No history of DVT or PE.  No recent immobilization. ?  ? ? ?Physical Exam  ? ?Triage Vital Signs: ?ED Triage Vitals  ?Enc Vitals Group  ?   BP 02/27/22 0949 (!) 163/89  ?   Pulse Rate 02/27/22 0949 86  ?   Resp 02/27/22 0949 (!) 21  ?   Temp 02/27/22 0949 98.7 ?F (37.1 ?C)  ?   Temp Source 02/27/22 0949 Oral  ?   SpO2 02/27/22 0949 100 %  ?   Weight --   ?   Height --   ?   Head Circumference --   ?   Peak Flow --   ?   Pain Score 02/27/22 0945 5  ?   Pain Loc --   ?   Pain Edu? --   ?   Excl. in [place]? --   ? ? ?Most recent vital signs: ?Vitals:  ? 02/27/22 1208 02/27/22 1219  ?BP:  (!) 155/71  ?Pulse:  85  ?Resp:  17  ?Temp: 97.7 ?F (36.5 ?C) 98.2 ?F (36.8 ?C)  ?SpO2: 100% 96%  ? ? ? ?General: Awake, no distress.  ?CV:  Good peripheral perfusion.  Regular rate and rhythm. ?Resp:  Normal effort.  Lungs clear bilaterally. ?Abd:  No distention.  No tenderness.  Negative Murphy sign. ?Other:  No lower extremity edema or asymmetry.  No popliteal tenderness. ? ? ?ED Results / Procedures / Treatments  ? ?Labs ?(all labs ordered are listed, but only abnormal  results are displayed) ?Labs Reviewed  ?COMPREHENSIVE METABOLIC PANEL - Abnormal; Notable for the following components:  ?    Result Value  ? Glucose, Bld 105 (*)   ? All other components within normal limits  ?CBC WITH DIFFERENTIAL/PLATELET  ?TROPONIN I (HIGH SENSITIVITY)  ? ? ? ?EKG ?Normal sinus rhythm, ventricular rate 87.  PR 125, QRS 96, QTc 42.  Slight ST depression noted in the inferolateral leads, no ST elevation noted. ? ? ?RADIOLOGY ?Chest x-ray: No evidence of acute cardiopulmonary abnormality ? ? ?I also independently reviewed and agree with radiologist interpretations. ? ? ?PROCEDURES: ? ?Critical Care performed: No ? ?.1-3 Lead EKG Interpretation ?Performed by: Duffy Bruce, MD ?Authorized by: Duffy Bruce, MD  ? ?  Interpretation: normal   ?  ECG rate:  70-90 ?  ECG rate assessment: normal   ?  Rhythm: sinus rhythm   ?  Ectopy: none   ?  Conduction: normal   ?Comments:  ?   Indication: Chest pain ? ? ? ?  MEDICATIONS ORDERED IN ED: ?Medications  ?alum & mag hydroxide-simeth (MAALOX/MYLANTA) 200-200-20 MG/5ML suspension 30 mL (30 mLs Oral Given 02/27/22 1102)  ?  And  ?lidocaine (XYLOCAINE) 2 % viscous mouth solution 15 mL (15 mLs Oral Given 02/27/22 1102)  ? ? ? ?IMPRESSION / MDM / ASSESSMENT AND PLAN / ED COURSE  ?I reviewed the triage vital signs and the nursing notes. ?             ?               ? ? ?The patient is on the cardiac monitor to evaluate for evidence of arrhythmia and/or significant heart rate changes. ? ? ?Ddx:  ?GERD, gastritis, atypical chest pain, chest wall pain, ACS, unlikely PE or dissection ? ? ?MDM:  ?56 year old female with history of GERD, here with atypical chest pain.  Pain is most consistent with likely GI etiology correlating with eating pizza last night, resolution with GI cocktail here in the ED, and history of similar presentations.  EKG shows some nonspecific ST depressions, but on review of records, this has been noted as far back as several years ago and patient  states that she has been told about this multiple times.  She has had constant pain with unchanged EKG from baseline and negative troponin, do not suspect ACS.  Abdomen is soft with no focal tenderness, CBC is without leukocytosis or anemia, normal CMP with no LFT abnormality or renal function normality, do not suspect intra-abdominal pathology or emergency. ? ?Given significant improvement with GI cocktail, negative troponin despite constant symptoms and low risk for ACS, will treat for possible GI related discomfort with outpatient PPI and return precautions. ? ? ?MEDICATIONS GIVEN IN ED: ?Medications  ?alum & mag hydroxide-simeth (MAALOX/MYLANTA) 200-200-20 MG/5ML suspension 30 mL (30 mLs Oral Given 02/27/22 1102)  ?  And  ?lidocaine (XYLOCAINE) 2 % viscous mouth solution 15 mL (15 mLs Oral Given 02/27/22 1102)  ? ? ? ?Consults:  ? ? ? ?EMR reviewed  ?Reviewed prior ED visits, as well as old EKGs ? ? ? ? ?FINAL CLINICAL IMPRESSION(S) / ED DIAGNOSES  ? ?Final diagnoses:  ?Atypical chest pain  ?Gastroesophageal reflux disease without esophagitis  ? ? ? ?Rx / DC Orders  ? ?ED Discharge Orders   ? ?      Ordered  ?  pantoprazole (PROTONIX) 40 MG tablet  Every morning       ? 02/27/22 1200  ?  sucralfate (CARAFATE) 1 g tablet  3 times daily with meals & bedtime       ? 02/27/22 1200  ? ?  ?  ? ?  ? ? ? ?Note:  This document was prepared using Dragon voice recognition software and may include unintentional dictation errors. ?  ?Duffy Bruce, MD ?02/27/22 1305 ? ?

## 2022-02-27 NOTE — ED Triage Notes (Signed)
Pt c/o central chest pressure with nausea this morning. Pt is NAD. ?

## 2022-12-21 DIAGNOSIS — I10 Essential (primary) hypertension: Secondary | ICD-10-CM | POA: Diagnosis not present

## 2022-12-21 DIAGNOSIS — G894 Chronic pain syndrome: Secondary | ICD-10-CM | POA: Diagnosis not present

## 2022-12-21 DIAGNOSIS — F1120 Opioid dependence, uncomplicated: Secondary | ICD-10-CM | POA: Diagnosis not present

## 2023-02-18 DIAGNOSIS — F1120 Opioid dependence, uncomplicated: Secondary | ICD-10-CM | POA: Diagnosis not present

## 2023-02-18 DIAGNOSIS — M1991 Primary osteoarthritis, unspecified site: Secondary | ICD-10-CM | POA: Diagnosis not present

## 2023-02-18 DIAGNOSIS — G894 Chronic pain syndrome: Secondary | ICD-10-CM | POA: Diagnosis not present

## 2023-02-18 DIAGNOSIS — I10 Essential (primary) hypertension: Secondary | ICD-10-CM | POA: Diagnosis not present

## 2023-03-11 ENCOUNTER — Ambulatory Visit
Admission: RE | Admit: 2023-03-11 | Discharge: 2023-03-11 | Disposition: A | Discharge status: Home / Self Care | Payer: Worker's Compensation | Attending: Family Medicine | Admitting: Family Medicine

## 2023-03-11 ENCOUNTER — Other Ambulatory Visit: Payer: Self-pay | Admitting: Physician Assistant

## 2023-03-11 ENCOUNTER — Ambulatory Visit
Admission: RE | Admit: 2023-03-11 | Discharge: 2023-03-11 | Disposition: A | Discharge status: Home / Self Care | Payer: Worker's Compensation | Source: Ambulatory Visit | Attending: Physician Assistant | Admitting: Physician Assistant

## 2023-03-11 DIAGNOSIS — R0781 Pleurodynia: Secondary | ICD-10-CM | POA: Diagnosis present

## 2023-04-22 DIAGNOSIS — G894 Chronic pain syndrome: Secondary | ICD-10-CM | POA: Diagnosis not present

## 2023-04-22 DIAGNOSIS — M159 Polyosteoarthritis, unspecified: Secondary | ICD-10-CM | POA: Diagnosis not present

## 2023-04-22 DIAGNOSIS — I10 Essential (primary) hypertension: Secondary | ICD-10-CM | POA: Diagnosis not present

## 2023-04-22 DIAGNOSIS — F1120 Opioid dependence, uncomplicated: Secondary | ICD-10-CM | POA: Diagnosis not present

## 2023-06-17 DIAGNOSIS — J329 Chronic sinusitis, unspecified: Secondary | ICD-10-CM | POA: Diagnosis not present

## 2023-06-17 DIAGNOSIS — M159 Polyosteoarthritis, unspecified: Secondary | ICD-10-CM | POA: Diagnosis not present

## 2023-06-17 DIAGNOSIS — F1120 Opioid dependence, uncomplicated: Secondary | ICD-10-CM | POA: Diagnosis not present

## 2023-06-17 DIAGNOSIS — G894 Chronic pain syndrome: Secondary | ICD-10-CM | POA: Diagnosis not present

## 2023-06-17 DIAGNOSIS — Z6841 Body Mass Index (BMI) 40.0 and over, adult: Secondary | ICD-10-CM | POA: Diagnosis not present

## 2023-06-17 DIAGNOSIS — I10 Essential (primary) hypertension: Secondary | ICD-10-CM | POA: Diagnosis not present

## 2023-08-16 DIAGNOSIS — I10 Essential (primary) hypertension: Secondary | ICD-10-CM | POA: Diagnosis not present

## 2023-08-16 DIAGNOSIS — G894 Chronic pain syndrome: Secondary | ICD-10-CM | POA: Diagnosis not present

## 2023-08-16 DIAGNOSIS — M159 Polyosteoarthritis, unspecified: Secondary | ICD-10-CM | POA: Diagnosis not present

## 2023-08-16 DIAGNOSIS — F1120 Opioid dependence, uncomplicated: Secondary | ICD-10-CM | POA: Diagnosis not present

## 2023-10-08 DIAGNOSIS — I10 Essential (primary) hypertension: Secondary | ICD-10-CM | POA: Diagnosis not present

## 2023-10-08 DIAGNOSIS — G894 Chronic pain syndrome: Secondary | ICD-10-CM | POA: Diagnosis not present

## 2023-10-08 DIAGNOSIS — M549 Dorsalgia, unspecified: Secondary | ICD-10-CM | POA: Diagnosis not present

## 2023-10-08 DIAGNOSIS — M159 Polyosteoarthritis, unspecified: Secondary | ICD-10-CM | POA: Diagnosis not present

## 2023-10-08 DIAGNOSIS — J209 Acute bronchitis, unspecified: Secondary | ICD-10-CM | POA: Diagnosis not present

## 2023-10-08 DIAGNOSIS — Z6841 Body Mass Index (BMI) 40.0 and over, adult: Secondary | ICD-10-CM | POA: Diagnosis not present

## 2023-10-08 DIAGNOSIS — M1991 Primary osteoarthritis, unspecified site: Secondary | ICD-10-CM | POA: Diagnosis not present

## 2023-10-08 DIAGNOSIS — F1120 Opioid dependence, uncomplicated: Secondary | ICD-10-CM | POA: Diagnosis not present

## 2023-10-08 DIAGNOSIS — J329 Chronic sinusitis, unspecified: Secondary | ICD-10-CM | POA: Diagnosis not present
